# Patient Record
Sex: Female | Born: 1970 | Race: White | Hispanic: No | Smoking: Never smoker
Health system: Southern US, Community
[De-identification: ages and names within clinical notes are randomized; demographics above are authoritative.]

## PROBLEM LIST (undated history)

## (undated) DIAGNOSIS — G5600 Carpal tunnel syndrome, unspecified upper limb: Secondary | ICD-10-CM

## (undated) DIAGNOSIS — E785 Hyperlipidemia, unspecified: Secondary | ICD-10-CM

## (undated) DIAGNOSIS — A4902 Methicillin resistant Staphylococcus aureus infection, unspecified site: Secondary | ICD-10-CM

## (undated) DIAGNOSIS — K219 Gastro-esophageal reflux disease without esophagitis: Secondary | ICD-10-CM

## (undated) DIAGNOSIS — N6019 Diffuse cystic mastopathy of unspecified breast: Secondary | ICD-10-CM

## (undated) DIAGNOSIS — G43909 Migraine, unspecified, not intractable, without status migrainosus: Secondary | ICD-10-CM

## (undated) DIAGNOSIS — E039 Hypothyroidism, unspecified: Secondary | ICD-10-CM

## (undated) DIAGNOSIS — M25512 Pain in left shoulder: Secondary | ICD-10-CM

## (undated) DIAGNOSIS — N200 Calculus of kidney: Secondary | ICD-10-CM

## (undated) DIAGNOSIS — R0600 Dyspnea, unspecified: Secondary | ICD-10-CM

## (undated) HISTORY — DX: Diffuse cystic mastopathy of unspecified breast: N60.19

## (undated) HISTORY — DX: Gastro-esophageal reflux disease without esophagitis: K21.9

## (undated) HISTORY — DX: Calculus of kidney: N20.0

## (undated) HISTORY — DX: Hyperlipidemia, unspecified: E78.5

## (undated) HISTORY — DX: Carpal tunnel syndrome, unspecified upper limb: G56.00

## (undated) HISTORY — DX: Methicillin resistant Staphylococcus aureus infection, unspecified site: A49.02

---

## 1993-04-16 HISTORY — PX: DIAGNOSTIC LAPAROSCOPY: SUR761

## 1998-06-03 ENCOUNTER — Other Ambulatory Visit: Admission: RE | Admit: 1998-06-03 | Discharge: 1998-06-03 | Payer: Self-pay | Admitting: Obstetrics and Gynecology

## 1998-11-29 ENCOUNTER — Other Ambulatory Visit: Admission: RE | Admit: 1998-11-29 | Discharge: 1998-11-29 | Payer: Self-pay | Admitting: Obstetrics and Gynecology

## 2000-01-25 ENCOUNTER — Other Ambulatory Visit: Admission: RE | Admit: 2000-01-25 | Discharge: 2000-01-25 | Payer: Self-pay | Admitting: Obstetrics and Gynecology

## 2000-07-22 ENCOUNTER — Other Ambulatory Visit: Admission: RE | Admit: 2000-07-22 | Discharge: 2000-07-22 | Payer: Self-pay | Admitting: Obstetrics and Gynecology

## 2001-01-24 ENCOUNTER — Other Ambulatory Visit: Admission: RE | Admit: 2001-01-24 | Discharge: 2001-01-24 | Payer: Self-pay | Admitting: Obstetrics and Gynecology

## 2002-03-19 ENCOUNTER — Other Ambulatory Visit: Admission: RE | Admit: 2002-03-19 | Discharge: 2002-03-19 | Payer: Self-pay | Admitting: Obstetrics and Gynecology

## 2002-04-16 DIAGNOSIS — G5600 Carpal tunnel syndrome, unspecified upper limb: Secondary | ICD-10-CM

## 2002-04-16 HISTORY — DX: Carpal tunnel syndrome, unspecified upper limb: G56.00

## 2003-03-22 ENCOUNTER — Other Ambulatory Visit: Admission: RE | Admit: 2003-03-22 | Discharge: 2003-03-22 | Payer: Self-pay | Admitting: Obstetrics and Gynecology

## 2004-03-23 ENCOUNTER — Other Ambulatory Visit: Admission: RE | Admit: 2004-03-23 | Discharge: 2004-03-23 | Payer: Self-pay | Admitting: *Deleted

## 2005-04-27 ENCOUNTER — Other Ambulatory Visit: Admission: RE | Admit: 2005-04-27 | Discharge: 2005-04-27 | Payer: Self-pay | Admitting: Obstetrics & Gynecology

## 2006-04-16 DIAGNOSIS — A4902 Methicillin resistant Staphylococcus aureus infection, unspecified site: Secondary | ICD-10-CM

## 2006-04-16 HISTORY — DX: Methicillin resistant Staphylococcus aureus infection, unspecified site: A49.02

## 2006-06-14 ENCOUNTER — Other Ambulatory Visit: Admission: RE | Admit: 2006-06-14 | Discharge: 2006-06-14 | Payer: Self-pay | Admitting: Obstetrics & Gynecology

## 2007-06-19 ENCOUNTER — Other Ambulatory Visit: Admission: RE | Admit: 2007-06-19 | Discharge: 2007-06-19 | Payer: Self-pay | Admitting: Obstetrics & Gynecology

## 2007-07-02 ENCOUNTER — Encounter: Admission: RE | Admit: 2007-07-02 | Discharge: 2007-07-02 | Payer: Self-pay | Admitting: Obstetrics & Gynecology

## 2007-07-14 ENCOUNTER — Encounter: Admission: RE | Admit: 2007-07-14 | Discharge: 2007-07-14 | Payer: Self-pay | Admitting: Obstetrics & Gynecology

## 2007-12-17 ENCOUNTER — Encounter: Admission: RE | Admit: 2007-12-17 | Discharge: 2007-12-17 | Payer: Self-pay | Admitting: Obstetrics & Gynecology

## 2008-06-24 ENCOUNTER — Other Ambulatory Visit: Admission: RE | Admit: 2008-06-24 | Discharge: 2008-06-24 | Payer: Self-pay | Admitting: Obstetrics & Gynecology

## 2011-04-05 ENCOUNTER — Ambulatory Visit
Admission: RE | Admit: 2011-04-05 | Discharge: 2011-04-05 | Disposition: A | Discharge status: Home / Self Care | Payer: Self-pay | Source: Ambulatory Visit | Attending: Unknown Physician Specialty | Admitting: Unknown Physician Specialty

## 2011-04-05 ENCOUNTER — Other Ambulatory Visit: Payer: Self-pay | Admitting: Unknown Physician Specialty

## 2011-04-05 DIAGNOSIS — T1490XA Injury, unspecified, initial encounter: Secondary | ICD-10-CM

## 2011-04-05 DIAGNOSIS — M25561 Pain in right knee: Secondary | ICD-10-CM

## 2011-09-05 ENCOUNTER — Other Ambulatory Visit: Payer: Self-pay | Admitting: Obstetrics & Gynecology

## 2011-09-05 DIAGNOSIS — Z1231 Encounter for screening mammogram for malignant neoplasm of breast: Secondary | ICD-10-CM

## 2011-10-04 ENCOUNTER — Ambulatory Visit
Admission: RE | Admit: 2011-10-04 | Discharge: 2011-10-04 | Disposition: A | Discharge status: Home / Self Care | Payer: 59 | Source: Ambulatory Visit | Attending: Obstetrics & Gynecology | Admitting: Obstetrics & Gynecology

## 2011-10-04 DIAGNOSIS — Z1231 Encounter for screening mammogram for malignant neoplasm of breast: Secondary | ICD-10-CM

## 2012-04-19 ENCOUNTER — Emergency Department
Admission: EM | Admit: 2012-04-19 | Discharge: 2012-04-19 | Disposition: A | Discharge status: Home / Self Care | Payer: 59 | Source: Home / Self Care | Attending: Family Medicine | Admitting: Family Medicine

## 2012-04-19 DIAGNOSIS — B309 Viral conjunctivitis, unspecified: Secondary | ICD-10-CM

## 2012-04-19 DIAGNOSIS — G43009 Migraine without aura, not intractable, without status migrainosus: Secondary | ICD-10-CM | POA: Insufficient documentation

## 2012-04-19 HISTORY — DX: Migraine, unspecified, not intractable, without status migrainosus: G43.909

## 2012-04-19 MED ORDER — POLYMYXIN B-TRIMETHOPRIM 10000-0.1 UNIT/ML-% OP SOLN: 1.0000 [drp] | OPHTHALMIC | Status: DC

## 2012-04-19 NOTE — ED Notes (Addendum)
Jane Hill complains of redness and pain in her right eye for 1 day. She has also had redness and drainage in her left eye beginning this am. Denies fever, chills or sweats. She has had some sore throat and runny nose for one month. She was seen by her doctor, Dr Sharee Pimple, and treated with an antibiotic.

## 2012-04-19 NOTE — ED Provider Notes (Signed)
History     CSN: [id number]  Arrival date & time 04/19/12  0908   First MD Initiated Contact with Patient 04/19/12 0932      Chief Complaint  Patient presents with  . Eye Drainage    Left eye drainage x this am   . Eye Problem    right eye red and painful x 1 day     HPI Comments: Patient complains of onset of mild redness to her right eye at about 10AM yesterday, developing increasing irritation through the day.  Later, about 4PM, her left eye developed mild redness and irritation.  She wears contacts and stopped using them.  This morning she awoke with both eyes equally red, but has increased discomfort in her right upper lid.  She normally has perennial rhinitis but feels that she is somewhat more congested than usual.  She feels well otherwise.  No known foreign bodies   The history is provided by the patient.    Past Medical History  Diagnosis Date  . Migraine     History reviewed. No pertinent past surgical history.  Family History  Problem Relation Age of Onset  . Cancer Other     breast  . Heart disease Other   . Heart disease Other     History  Substance Use Topics  . Smoking status: Never Smoker   . Smokeless tobacco: Not on file  . Alcohol Use: Yes    OB History    Grav Para Term Preterm Abortions TAB SAB Ect Mult Living                  Review of Systems No sore throat No cough No pleuritic pain No wheezing + mild nasal congestion ? post-nasal drainage No sinus pain/pressure + itchy/red eyes No earache No hemoptysis No SOB No fever/chills No nausea No vomiting No abdominal pain No diarrhea No urinary symptoms No skin rashes No fatigue No myalgias No headache Used OTC meds without relief  Allergies  Review of patient's allergies indicates no known allergies.  Home Medications   Current Outpatient Rx  Name  Route  Sig  Dispense  Refill  . ELETRIPTAN HYDROBROMIDE 40 MG PO TABS   Oral   One tablet by mouth at onset of headache.  May repeat in 2 hours if headache persists or recurs. may repeat in 2 hours if necessary         . PROPRANOLOL HCL ER 120 MG PO CP24   Oral   Take 120 mg by mouth daily.         Marland Kitchen VITAMIN C 500 MG PO TABS   Oral   Take 500 mg by mouth daily.         Marland Kitchen ZONISAMIDE 100 MG PO CAPS   Oral   Take 200 mg by mouth daily.         Marland Kitchen POLYMYXIN B-TRIMETHOPRIM 10000-0.1 UNIT/ML-% OP SOLN   Both Eyes   Place 1 drop into both eyes every 4 (four) hours. (Rx void after 04/27/12)   10 mL   0     BP 128/84  Pulse 64  Temp 98.4 F (36.9 C) (Oral)  Resp 18  Ht 5' 5.5" (1.664 m)  Wt 217 lb (98.431 kg)  BMI 35.56 kg/m2  SpO2 100%  LMP 04/18/2012  Physical Exam Nursing notes and Vital Signs reviewed. Appearance:  Patient appears stated age, and in no acute distress.  Patient is obese (BMI 35.6) Eyes:  Pupils are  equal, round, and reactive to light and accomodation.  Extraocular movement is intact.  Conjunctivae are mildly injected.  No discharge noted.  No photophobia.  No lid swelling although there is mild tenderness of the right upper lid.  Right lid eversion reveals no chalazion or foreign body Ears:  Canals normal.  Tympanic membranes normal.  Nose:  Mildly congested turbinates.  No sinus tenderness.  Pharynx:  Normal Neck:  Supple.  Slightly tender shotty posterior nodes are palpated bilaterally  Lungs:  Clear to auscultation.  Breath sounds are equal.  Heart:  Regular rate and rhythm without murmurs, rubs, or gallops.  Skin:  No rash present.     ED Course  Procedures  none      1. Viral conjunctivitis; no evidence bacterial infection; suspect early viral URI       MDM   Instil cool lubricating eye drops or eye wetting solution several times daily for about two days.  If not improving begin antibiotic eye drops.  Given Rx for Polytrim opth soln to hold. Followup with ophthalmologist if not improving about 5 to 7 days.   Advised that she will probably develop other URI  symptoms         Lattie Haw, MD 04/19/12 1149

## 2012-05-23 DIAGNOSIS — G43009 Migraine without aura, not intractable, without status migrainosus: Secondary | ICD-10-CM | POA: Insufficient documentation

## 2012-10-15 ENCOUNTER — Encounter: Payer: Self-pay | Admitting: Certified Nurse Midwife

## 2012-10-15 ENCOUNTER — Encounter: Payer: Self-pay | Admitting: Obstetrics & Gynecology

## 2012-10-16 ENCOUNTER — Ambulatory Visit (INDEPENDENT_AMBULATORY_CARE_PROVIDER_SITE_OTHER): Payer: 59 | Admitting: Obstetrics & Gynecology

## 2012-10-16 ENCOUNTER — Encounter: Payer: Self-pay | Admitting: Obstetrics & Gynecology

## 2012-10-16 VITALS — BP 110/78 | Ht 65.25 in | Wt 218.0 lb

## 2012-10-16 DIAGNOSIS — Z0000 Encounter for general adult medical examination without abnormal findings: Secondary | ICD-10-CM

## 2012-10-16 DIAGNOSIS — Z01419 Encounter for gynecological examination (general) (routine) without abnormal findings: Secondary | ICD-10-CM

## 2012-10-16 LAB — POCT URINALYSIS DIPSTICK
Blood, UA: NEGATIVE
Protein, UA: NEGATIVE
Urobilinogen, UA: NEGATIVE
pH, UA: 6

## 2012-10-16 NOTE — Progress Notes (Signed)
42 y.o. G0P0000 DivorcedCaucasianF here for annual exam.  Going to [facility] for girls weekend in three weeks.    Patient's last menstrual period was 09/19/2012.          Sexually active: no  The current method of family planning is abstinence.  She does have a Skyla IUD placed 11/27/11 Exercising: yes Home exercise routine includes Daily Burn. Smoker:  no  Health Maintenance: Pap: 09/20/2011 neg with neg HR HPV History of abnormal Pap:  Yes, 2/98 had colposcopy MMG:  09/2011 benign Colonoscopy:  No BMD:  No TDaP:  3/09 Screening Labs: today, Hb today: 13.4, Urine today: neg   reports that she has never smoked. She has never used smokeless tobacco. She reports that she drinks about 0.5 ounces of alcohol per week. She reports that she does not use illicit drugs.  Past Medical History  Diagnosis Date  . Migraine   . Fibrocystic breast   . Glucosuria   . Fibromyalgia   . Interstitial cystitis 2002  . Carpal tunnel syndrome 2004    R  . Diabetes mellitus without complication 2004    diet control  . Community acquired MRSA infection 2008    Past Surgical History  Procedure Laterality Date  . Breast biopsy  1990  . Diagnostic laparoscopy  1995    Current Outpatient Prescriptions  Medication Sig Dispense Refill  . baclofen (LIORESAL) 10 MG tablet Take 10 mg by mouth as needed.       . eletriptan (RELPAX) 40 MG tablet One tablet by mouth at onset of headache. May repeat in 2 hours if headache persists or recurs. may repeat in 2 hours if necessary      . Levonorgestrel (SKYLA) 13.5 MG IUD by Intrauterine route.      . Multiple Vitamins-Minerals (MULTIVITAMIN PO) Take by mouth daily.       . propranolol ER (INDERAL LA) 120 MG 24 hr capsule Take 120 mg by mouth daily.      . vitamin C (ASCORBIC ACID) 500 MG tablet Take 500 mg by mouth daily.      Marland Kitchen zonisamide (ZONEGRAN) 100 MG capsule Take 100 mg by mouth 3 (three) times daily.       Marland Kitchen Fexofenadine HCl (ALLEGRA PO) Take by mouth as  needed.        No current facility-administered medications for this visit.    Family History  Problem Relation Age of Onset  . Cancer Other     breast  . Heart disease Other   . Heart disease Other     ROS:  Pertinent items are noted in HPI.  Otherwise, a comprehensive ROS was negative.  Exam:   BP 110/78  Ht 5' 5.25" (1.657 m)  Wt 218 lb (98.884 kg)  BMI 36.01 kg/m2  LMP 09/19/2012  Weight change:+10 Height: 5' 5.25" (165.7 cm)  Ht Readings from Last 3 Encounters:  10/16/12 5' 5.25" (1.657 m)  04/19/12 5' 5.5" (1.664 m)    General appearance: alert, cooperative and appears stated age Head: Normocephalic, without obvious abnormality, atraumatic Neck: no adenopathy, supple, symmetrical, trachea midline and thyroid normal to inspection and palpation Lungs: clear to auscultation bilaterally Breasts: normal appearance, no masses or tenderness Heart: regular rate and rhythm Abdomen: soft, non-tender; bowel sounds normal; no masses,  no organomegaly Extremities: extremities normal, atraumatic, no cyanosis or edema Skin: Skin color, texture, turgor normal. No rashes or lesions Lymph nodes: Cervical, supraclavicular, and axillary nodes normal. No abnormal inguinal nodes palpated Neurologic: Grossly  normal   Pelvic: External genitalia:  no lesions              Urethra:  normal appearing urethra with no masses, tenderness or lesions              Bartholins and Skenes: normal                 Vagina: normal appearing vagina with normal color and discharge, no lesions              Cervix: no lesions              Pap taken: no Bimanual Exam:  Uterus:  normal size, contour, position, consistency, mobility, non-tender              Adnexa: normal adnexa and no mass, fullness, tenderness               Rectovaginal: Confirms               Anus:  normal sphincter tone, no lesions  A:  Well Woman with normal exam Skyla IUD placed 11/27/11.  P:   Mammogram yearly.  Pt states she will  schedule.  Has reminder. pap smear with HR HPV neg 6/13 Lipids (non fasting), CMP, TSH return annually or prn  An After Visit Summary was printed and given to the patient.

## 2012-10-16 NOTE — Patient Instructions (Addendum)

## 2012-10-17 LAB — LIPID PANEL
Cholesterol: 190 mg/dL (ref 0–200)
Total CHOL/HDL Ratio: 4.4 Ratio
VLDL: 22 mg/dL (ref 0–40)

## 2012-10-17 LAB — COMPREHENSIVE METABOLIC PANEL
Alkaline Phosphatase: 79 U/L (ref 39–117)
Glucose, Bld: 63 mg/dL — ABNORMAL LOW (ref 70–99)
Sodium: 137 mEq/L (ref 135–145)
Total Bilirubin: 0.4 mg/dL (ref 0.3–1.2)
Total Protein: 6.8 g/dL (ref 6.0–8.3)

## 2012-10-17 LAB — TSH: TSH: 4.305 u[IU]/mL (ref 0.350–4.500)

## 2012-10-20 ENCOUNTER — Telehealth: Payer: Self-pay

## 2012-10-20 NOTE — Telephone Encounter (Signed)
7/7 lmtcb//kn 

## 2012-10-20 NOTE — Telephone Encounter (Signed)
Message copied by Elisha Headland on Mon Oct 20, 2012  8:53 AM ------      Message from: Jerene Bears      Created: Sat Oct 18, 2012  9:15 PM       Inform cholesterol ok.  Total 190.  LDL 125.  HDL low at 43.  Needs to be followed yearly and tested fasting next time.  TSH nl.  CMP showed mildly elevated creatinine.  This is just mildly increased.  Other values normal.  Will also retest next year. ------

## 2012-11-10 NOTE — Telephone Encounter (Signed)
7/28 lmtcb/kn 

## 2013-01-12 NOTE — Telephone Encounter (Signed)
Left several messages for patient and sent a letter to call us back. No response. Per Dr Lisabeth Register to file this. Will notify patient if she calls back.

## 2013-03-09 ENCOUNTER — Other Ambulatory Visit: Payer: Self-pay

## 2013-03-09 DIAGNOSIS — Z1231 Encounter for screening mammogram for malignant neoplasm of breast: Secondary | ICD-10-CM

## 2013-04-03 ENCOUNTER — Ambulatory Visit
Admission: RE | Admit: 2013-04-03 | Discharge: 2013-04-03 | Disposition: A | Discharge status: Home / Self Care | Payer: 59 | Source: Ambulatory Visit

## 2013-04-03 DIAGNOSIS — Z1231 Encounter for screening mammogram for malignant neoplasm of breast: Secondary | ICD-10-CM

## 2013-12-29 ENCOUNTER — Encounter: Payer: Self-pay | Admitting: Obstetrics & Gynecology

## 2013-12-29 ENCOUNTER — Ambulatory Visit (INDEPENDENT_AMBULATORY_CARE_PROVIDER_SITE_OTHER): Payer: 59 | Admitting: Obstetrics & Gynecology

## 2013-12-29 VITALS — Ht 65.25 in | Wt 227.0 lb

## 2013-12-29 DIAGNOSIS — Z30432 Encounter for removal of intrauterine contraceptive device: Secondary | ICD-10-CM

## 2013-12-29 DIAGNOSIS — Z124 Encounter for screening for malignant neoplasm of cervix: Secondary | ICD-10-CM

## 2013-12-29 DIAGNOSIS — Z01419 Encounter for gynecological examination (general) (routine) without abnormal findings: Secondary | ICD-10-CM

## 2013-12-29 DIAGNOSIS — Z30430 Encounter for insertion of intrauterine contraceptive device: Secondary | ICD-10-CM

## 2013-12-29 LAB — POCT URINALYSIS DIPSTICK
BILIRUBIN UA: NEGATIVE
GLUCOSE UA: NEGATIVE
Ketones, UA: NEGATIVE
NITRITE UA: NEGATIVE
PH UA: 5
Protein, UA: NEGATIVE
UROBILINOGEN UA: NEGATIVE

## 2013-12-29 MED ORDER — MISOPROSTOL 200 MCG PO TABS: ORAL_TABLET | ORAL | Status: DC

## 2013-12-29 NOTE — Progress Notes (Signed)
43 y.o. G0P0000 Divorced CaucasianF here for annual exam.  Last three months, bleeding more days than not.  None of them are heavy but this is really annoying for pt.  Most of the time, she only needs a panty liner.   Seeing PCP:  Dr. Idelle Leech, [place], next week.  Will have blood work then.     Patient's last menstrual period was 12/18/2013.          Sexually active: No.  The current method of family planning is IUD.  Placed 11/27/11. Exercising: Yes.    Home exercise routine includes Daily Burn videos. Smoker:  no  Health Maintenance: Pap:  09/20/2011 neg HR HPV History of abnormal Pap:  Yes, 2/98 has coloscopy MMG:  04/03/13 Bi-Rads Neg Colonoscopy:  No BMD:   No TDaP:  06/2007 Screening Labs: pcp, Hb today: pcp, Urine today: RBC-trace WBC-trace, ph: 5.0   reports that she has never smoked. She has never used smokeless tobacco. She reports that she drinks about .5 ounces of alcohol per week. She reports that she does not use illicit drugs.  Past Medical History  Diagnosis Date  . Migraine   . Fibrocystic breast   . Carpal tunnel syndrome 2004    R  . Community acquired MRSA infection 2008    Past Surgical History  Procedure Laterality Date  . Diagnostic laparoscopy  1995    LOA    Current Outpatient Prescriptions  Medication Sig Dispense Refill  . Fexofenadine HCl (ALLEGRA PO) Take by mouth as needed.       . Levonorgestrel (SKYLA) 13.5 MG IUD by Intrauterine route.      . pyridOXINE (VITAMIN B-6) 100 MG tablet Take 100 mg by mouth daily.      . vitamin C (ASCORBIC ACID) 500 MG tablet Take 500 mg by mouth daily.       No current facility-administered medications for this visit.    Family History  Problem Relation Age of Onset  . Cancer Paternal Grandmother 73    breast  . Heart disease Maternal Grandmother   . Heart disease Maternal Grandfather     ROS:  Pertinent items are noted in HPI.  Otherwise, a comprehensive ROS was negative.  Exam:   Ht 5' 5.25"  (1.657 m)  Wt 227 lb (102.967 kg)  BMI 37.50 kg/m2  LMP 12/18/2013    Height: 5' 5.25" (165.7 cm)  Ht Readings from Last 3 Encounters:  12/29/13 5' 5.25" (1.657 m)  10/16/12 5' 5.25" (1.657 m)  04/19/12 5' 5.5" (1.664 m)    General appearance: alert, cooperative and appears stated age Head: Normocephalic, without obvious abnormality, atraumatic Neck: no adenopathy, supple, symmetrical, trachea midline and thyroid normal to inspection and palpation Lungs: clear to auscultation bilaterally Breasts: normal appearance, no masses or tenderness Heart: regular rate and rhythm Abdomen: soft, non-tender; bowel sounds normal; no masses,  no organomegaly Extremities: extremities normal, atraumatic, no cyanosis or edema Skin: Skin color, texture, turgor normal. No rashes or lesions Lymph nodes: Cervical, supraclavicular, and axillary nodes normal. No abnormal inguinal nodes palpated Neurologic: Grossly normal   Pelvic: External genitalia:  no lesions              Urethra:  normal appearing urethra with no masses, tenderness or lesions              Bartholins and Skenes: normal                 Vagina: normal appearing vagina with  normal color and discharge, no lesions              Cervix: no lesions              Pap taken: Yes.   Bimanual Exam:  Uterus:  normal size, contour, position, consistency, mobility, non-tender              Adnexa: normal adnexa and no mass, fullness, tenderness               Rectovaginal: Confirms               Anus:  normal sphincter tone, no lesions  A:  Well Woman with normal exam  Skyla IUD placed 11/27/11. DUB last three months. Migraines.  Off all medications.   DUB with Skyla  P: Mammogram yearly.   pap smear with HR HPV neg 6/13.  Pap only today. Due to DUB, d/w pt options--removal and replacement with new Skyla, PUS first, change to Mirena.  Pt wants to try Mirena IUD first.  Cytotec 239mcg pv pm and am before procedure. Labs/appt with PCP next  week return annually or prn  An After Visit Summary was printed and given to the patient.

## 2014-01-01 LAB — IPS PAP TEST WITH REFLEX TO HPV

## 2014-01-07 ENCOUNTER — Ambulatory Visit (INDEPENDENT_AMBULATORY_CARE_PROVIDER_SITE_OTHER): Payer: 59 | Admitting: Obstetrics & Gynecology

## 2014-01-07 VITALS — BP 108/70 | HR 64 | Resp 12 | Ht 65.25 in | Wt 226.6 lb

## 2014-01-07 DIAGNOSIS — Z30430 Encounter for insertion of intrauterine contraceptive device: Secondary | ICD-10-CM

## 2014-01-07 DIAGNOSIS — Z30432 Encounter for removal of intrauterine contraceptive device: Secondary | ICD-10-CM

## 2014-01-07 DIAGNOSIS — Z30433 Encounter for removal and reinsertion of intrauterine contraceptive device: Secondary | ICD-10-CM

## 2014-01-07 NOTE — Progress Notes (Signed)
Patient ID: Jane Hill, female   DOB: December 03, 1970, 43 y.o.   MRN: [id number]  43 y.o. G0P0000 Single Caucasian female presents for removal of Skyla IUD due to DUB.  Pt has decided to try Mirena IUD as she has liked the lack of "hormonal" symptoms and the ease of use.  iShe has been counseled about alternative forms of birth control including oral contraceptives, progesterone methods, IUD, barrier method, and sterilization.  She has decided to proceed with IUD placement.  Currently, she denies any vaginal symptoms or STD concerns.  LMP:  Patient's last menstrual period was 12/18/2013.  Healthy female:  WNWDWF NAD  Pelvic exam: Vulva;normal female genitalia Vagina:normal vagina, no discharge, exudate, lesion, or erythema Cervix:Non-tender, Negative CMT, no lesions or redness Uterus:normal shape, position and consistency   After patient read information booklet and all questions were answered, informed consent was obtained.    Procedure:  Speculum inserted into vagina. Cervix visualized and cleansed with betadine solution X 3. Paracervical block was placed.  1% Lidocaine was used.  8cc total.  Lot #: [id number]  Exp:09/15/14.  Tenaculum placed on cervix at 12 o'clock position.  Skyla IUD removed first.  Uterus sounded to 7 centimeters.  IUD removed from sterile packet and under sterile conditions inserted to fundus of uterus.  Introducer removed without difficulty.  IUD string trimmed to 2 centimeters.  Remainder string given to patient to feel for identification.  Tenaculum removed.  minimal bleeding noted.  Speculum removed.  Uterus palpated normal.  Patient tolerated procedure well.  IUD Lot #: [id number].  Exp: 8/17.  Package information attached to consent and scanned into EPIC.  A: Insertion of Mirena, Lot # [id number], Expiration date 8/17   P: Return to office 6 weeks for recheck Pt knows IUD needs to be replaced approximately 12/2018--5 years.  Instructions provided.

## 2014-02-11 ENCOUNTER — Telehealth: Payer: Self-pay | Admitting: Obstetrics & Gynecology

## 2014-02-11 NOTE — Telephone Encounter (Signed)
Spoke with patient. Patient states that she has an IUD recheck appointment on 11/3 but would like to schedule to have IUD removed. "I have been having bleeding every day except one week and cramping since I switched from the [facility] to the [employer]. I just want to have it taken out to let my body do it's own thing." Patient had skyla removed and mirena inserted for DUB on 01/07/14. Cancelled appointment for 11/3. Scheduled patient for first available procedure slot for IUD removal on 11/20 at 3:30 pm with Dr.Miller. Patient is agreeable to date and time. Patient requesting to have cytotec again for removal. Advised patient will send a message to Dr.Miller to let her know of removal and request for prescription. Advised will return call after speaking with Dr.Miller. Patient is agreeable to date and time.  Okay to place order for cytotec for removal?

## 2014-02-11 NOTE — Telephone Encounter (Signed)
Pt called during lunch wanting to change her appt on 02/16/14 she wants the have the IUD removed.

## 2014-02-12 NOTE — Telephone Encounter (Signed)
Spoke with patient. Advised patient of message as seen below from [place]. Patient is agreeable and verbalizes understanding.

## 2014-02-12 NOTE — Telephone Encounter (Signed)
Doesn't need cytotec for removal.  I will just remove it when I see her and counsel her about typical bleeding patterns.  Thanks.  Encounter closed.

## 2014-02-16 ENCOUNTER — Ambulatory Visit: Payer: 59 | Admitting: Obstetrics & Gynecology

## 2014-03-05 ENCOUNTER — Ambulatory Visit: Payer: 59 | Admitting: Obstetrics & Gynecology

## 2014-03-08 ENCOUNTER — Ambulatory Visit: Payer: 59 | Admitting: Obstetrics & Gynecology

## 2014-03-08 ENCOUNTER — Telehealth: Payer: Self-pay

## 2014-03-08 DIAGNOSIS — N938 Other specified abnormal uterine and vaginal bleeding: Secondary | ICD-10-CM

## 2014-03-08 NOTE — Telephone Encounter (Signed)
Orders placed for IUD removal and possible PUS. Patient has appointment tomorrow 11/24 at 12:45pm with Dr.Miller. See previous telephone encounter.  Cc: Jane Hill  Routing to provider for final review. Patient agreeable to disposition. Will close encounter

## 2014-03-09 ENCOUNTER — Encounter: Payer: Self-pay | Admitting: Obstetrics & Gynecology

## 2014-03-09 ENCOUNTER — Ambulatory Visit (INDEPENDENT_AMBULATORY_CARE_PROVIDER_SITE_OTHER): Payer: 59 | Admitting: Obstetrics & Gynecology

## 2014-03-09 VITALS — BP 128/80 | HR 72 | Resp 16 | Ht 65.25 in | Wt 235.0 lb

## 2014-03-09 DIAGNOSIS — Z30432 Encounter for removal of intrauterine contraceptive device: Secondary | ICD-10-CM

## 2014-03-09 DIAGNOSIS — N938 Other specified abnormal uterine and vaginal bleeding: Secondary | ICD-10-CM

## 2014-03-13 NOTE — Progress Notes (Signed)
43 y.o. G0P0000 Single Caucasian female presents for consideration of removal of IUD.  Pt has spotted since placement of both Skyla and then Mirena IUD.  Just totally sick of it.  D/W pt possible PUS first to confirm correct placement.  Pt just wants IUD out.  Doesn't really matter to her if in the correct location.  Just wants bleeding to stop.  Has PUS last year without any pathology present.  Not SA so just wants to wait and see what happens with her bleeding before doing anything else hormonal.  LMP:  No LMP recorded. Patient is not currently having periods (Reason: IUD).  Healthy female:  WNWD WF NAD Abdomen: soft, non-tender  Pelvic exam: Vulva;normal female genitalia Vagina:normal vagina, no discharge, exudate, lesion, or erythema Cervix:Non-tender, Negative CMT, no lesions or redness, IUD string noted Uterus:normal shape, position and consistency   Procedure:  Speculum inserted into vagina. IUD string seen.  Grasped with ringed forceps and with one pull, IUD removed.  Pt tolerated procedure well.   A: Removal of Mirena   P: Pt will call if still spotting after another month for repeat PUS or if desires to start OCPs again.  All questions answered.

## 2014-05-25 ENCOUNTER — Telehealth: Payer: Self-pay | Admitting: Obstetrics & Gynecology

## 2014-05-25 ENCOUNTER — Other Ambulatory Visit: Payer: Self-pay

## 2014-05-25 ENCOUNTER — Ambulatory Visit (INDEPENDENT_AMBULATORY_CARE_PROVIDER_SITE_OTHER): Payer: No Typology Code available for payment source | Admitting: Obstetrics & Gynecology

## 2014-05-25 VITALS — BP 118/78 | HR 64 | Resp 16 | Ht 65.25 in | Wt 241.6 lb

## 2014-05-25 DIAGNOSIS — N644 Mastodynia: Secondary | ICD-10-CM

## 2014-05-25 DIAGNOSIS — Z1231 Encounter for screening mammogram for malignant neoplasm of breast: Secondary | ICD-10-CM

## 2014-05-25 NOTE — Progress Notes (Signed)
Subjective:     Patient ID: Jane Hill, female   DOB: 06-10-1970, 44 y.o.   MRN: [id number]  HPI 44 yo G0P0 SWF here for right sided breast/axillary pain that has been present for a couple of weeks.  Pain is dull and achy and not enough to require pain medications.  Pt denies any trauma or breast lumps that she has felt.  Denies nipple discharge.  No skin changes.  She called to make an appt for a screening MMG and was advised she needed to be seen first as she may need a diagnostic mammogram.  Last MMG 03/24/13 at the [place].  This was negative.  She has Grade B density.  No family hx of breast cancer or other breast disease.  Review of Systems  All other systems reviewed and are negative.      Objective:   Physical Exam  Constitutional: She appears well-developed and well-nourished.  Neck: Normal range of motion. Neck supple. No thyromegaly present.  Pulmonary/Chest: Right breast exhibits tenderness (in upper outer quadrant). Right breast exhibits no inverted nipple, no mass, no nipple discharge and no skin change. Left breast exhibits no inverted nipple, no mass, no nipple discharge, no skin change and no tenderness. Breasts are symmetrical.  Lymphadenopathy:    She has no cervical adenopathy.       Assessment:     Breast tenderness No discrete mass or abnormal findings     Plan:     I do not think she needs a diagnostic test as this time.  Will have my triage nurse call and schedule pt for a screening MMG.  Caffeine intake discussed.  Pt aware to notify me if she has persistent pain after her next menstrual cycle.  Voices understanding.

## 2014-05-25 NOTE — Progress Notes (Signed)
Patient scheduled for screening mammogram at [facility] for 06/03/14 at 0900. Patient agreeable to time/date/location.

## 2014-05-25 NOTE — Telephone Encounter (Signed)
[facility] calling [facility] told her to call for a referral for tenderness under her arm.

## 2014-05-25 NOTE — Telephone Encounter (Signed)
Patient reports one month of tenderness under R arm when completing breast self exam. Tried to schedule her screening mammogram and was referred back to our office. Appointment with Dr.  Sabra Heck scheduled for today for breast check. Routing to provider for final review. Patient agreeable to disposition. Will close encounter

## 2014-05-30 ENCOUNTER — Encounter: Payer: Self-pay | Admitting: Obstetrics & Gynecology

## 2014-06-03 ENCOUNTER — Ambulatory Visit
Admission: RE | Admit: 2014-06-03 | Discharge: 2014-06-03 | Disposition: A | Discharge status: Home / Self Care | Payer: 59 | Source: Ambulatory Visit

## 2014-06-03 DIAGNOSIS — Z1231 Encounter for screening mammogram for malignant neoplasm of breast: Secondary | ICD-10-CM

## 2014-06-04 ENCOUNTER — Other Ambulatory Visit: Payer: Self-pay | Admitting: Obstetrics & Gynecology

## 2014-06-04 DIAGNOSIS — R928 Other abnormal and inconclusive findings on diagnostic imaging of breast: Secondary | ICD-10-CM

## 2014-06-11 ENCOUNTER — Other Ambulatory Visit: Payer: Self-pay

## 2014-06-11 ENCOUNTER — Other Ambulatory Visit: Payer: Self-pay | Admitting: Obstetrics & Gynecology

## 2014-06-11 DIAGNOSIS — R928 Other abnormal and inconclusive findings on diagnostic imaging of breast: Secondary | ICD-10-CM

## 2014-06-14 ENCOUNTER — Ambulatory Visit
Admission: RE | Admit: 2014-06-14 | Discharge: 2014-06-14 | Disposition: A | Discharge status: Home / Self Care | Payer: 59 | Source: Ambulatory Visit | Attending: Obstetrics & Gynecology | Admitting: Obstetrics & Gynecology

## 2014-06-14 ENCOUNTER — Other Ambulatory Visit: Payer: Self-pay | Admitting: Obstetrics & Gynecology

## 2014-06-14 DIAGNOSIS — R928 Other abnormal and inconclusive findings on diagnostic imaging of breast: Secondary | ICD-10-CM

## 2014-12-14 ENCOUNTER — Ambulatory Visit (INDEPENDENT_AMBULATORY_CARE_PROVIDER_SITE_OTHER): Payer: No Typology Code available for payment source | Admitting: Obstetrics & Gynecology

## 2014-12-14 ENCOUNTER — Encounter: Payer: Self-pay | Admitting: Obstetrics & Gynecology

## 2014-12-14 VITALS — BP 110/70 | HR 64 | Resp 16 | Ht 65.5 in | Wt 238.0 lb

## 2014-12-14 DIAGNOSIS — Z124 Encounter for screening for malignant neoplasm of cervix: Secondary | ICD-10-CM

## 2014-12-14 DIAGNOSIS — Z01419 Encounter for gynecological examination (general) (routine) without abnormal findings: Secondary | ICD-10-CM

## 2014-12-14 DIAGNOSIS — Z0000 Encounter for general adult medical examination without abnormal findings: Secondary | ICD-10-CM | POA: Diagnosis not present

## 2014-12-14 LAB — POCT URINALYSIS DIPSTICK
BILIRUBIN UA: NEGATIVE
Blood, UA: NEGATIVE
Glucose, UA: NEGATIVE
Ketones, UA: NEGATIVE
Nitrite, UA: NEGATIVE
PH UA: 5
Protein, UA: NEGATIVE
Urobilinogen, UA: NEGATIVE

## 2014-12-14 LAB — HEMOGLOBIN, FINGERSTICK: HEMOGLOBIN, FINGERSTICK: 12.6 g/dL (ref 12.0–16.0)

## 2014-12-14 MED ORDER — FLUCONAZOLE 150 MG PO TABS: 150.0000 mg | ORAL_TABLET | Freq: Once | ORAL | Status: DC

## 2014-12-14 NOTE — Progress Notes (Signed)
44 y.o. G0P0000 DivorcedCaucasianF here for annual exam.  Had an "okay" summer.  Last grandparent died this summer so this has been sad for the patient.  Pt reports cycles are the "same".  Flow is heavy at times.  Having some intermittent discharge.  Comes and goes.  Not sexually active.  PCP:  Dr. Dyane Dustman.  Last blood work was in march with appt.      Patient's last menstrual period was 12/04/2014.          Sexually active: No.  The current method of family planning is none.    Exercising: Yes.    walking Smoker:  no  Health Maintenance: Pap:  12/29/13 ASCUS/negative HR HPV History of abnormal Pap:  Yes 2/98 colposcopy MMG:  06/03/14 MMG, 06/14/14 3D diag right breast-BiRads 1-normal Colonoscopy:  none BMD:   none TDaP:  3/09 Screening Labs: , Hb today: 12.6, Urine today: WBC-trace   reports that she has never smoked. She has never used smokeless tobacco. She reports that she drinks about 0.6 - 1.2 oz of alcohol per week. She reports that she does not use illicit drugs.  Past Medical History  Diagnosis Date  . Migraine   . Fibrocystic breast   . Carpal tunnel syndrome 2004    R  . Community acquired MRSA infection 2008    Past Surgical History  Procedure Laterality Date  . Diagnostic laparoscopy  1995    LOA    Current Outpatient Prescriptions  Medication Sig Dispense Refill  . Fexofenadine HCl (ALLEGRA PO) Take by mouth as needed.     . Multiple Vitamins-Minerals (MULTIVITAL PO) Take by mouth. Maxi one daily MVI    . promethazine (PHENERGAN) 25 MG tablet as needed.     . SUMAtriptan (IMITREX) 100 MG tablet Take 100 mg by mouth once.   5  . topiramate (TOPAMAX) 25 MG tablet Currently taking 75mg  daily    . levothyroxine (SYNTHROID, LEVOTHROID) 50 MCG tablet TK 1 T PO D  3   No current facility-administered medications for this visit.    Family History  Problem Relation Age of Onset  . Cancer Paternal Grandmother 61    breast  . Heart disease Maternal  Grandmother   . Heart disease Maternal Grandfather   . Breast cancer Mother 73    02/2013    ROS:  Pertinent items are noted in HPI.  Otherwise, a comprehensive ROS was negative.  Exam:   BP 110/70 mmHg  Pulse 64  Resp 16  Ht 5' 5.5" (1.664 m)  Wt 238 lb (107.956 kg)  BMI 38.99 kg/m2  LMP 12/04/2014  Weight change: +11#Height: 5' 5.5" (166.4 cm)  Ht Readings from Last 3 Encounters:  12/14/14 5' 5.5" (1.664 m)  05/25/14 5' 5.25" (1.657 m)  03/09/14 5' 5.25" (1.657 m)    General appearance: alert, cooperative and appears stated age Head: Normocephalic, without obvious abnormality, atraumatic Neck: no adenopathy, supple, symmetrical, trachea midline and thyroid normal to inspection and palpation Lungs: clear to auscultation bilaterally Breasts: normal appearance, no masses or tenderness Heart: regular rate and rhythm Abdomen: soft, non-tender; bowel sounds normal; no masses,  no organomegaly Extremities: extremities normal, atraumatic, no cyanosis or edema Skin: Skin color, texture, turgor normal. No rashes or lesions Lymph nodes: Cervical, supraclavicular, and axillary nodes normal. No abnormal inguinal nodes palpated Neurologic: Grossly normal   Pelvic: External genitalia:  no lesions              Urethra:  normal appearing  urethra with no masses, tenderness or lesions              Bartholins and Skenes: normal                 Vagina: normal appearing vagina with normal color and discharge, no lesions              Cervix: no lesions              Pap taken: Yes.   Bimanual Exam:  Uterus:  normal size, contour, position, consistency, mobility, non-tender              Adnexa: normal adnexa and no mass, fullness, tenderness               Rectovaginal: Confirms               Anus:  normal sphincter tone, no lesions  Chaperone was present for exam.  A:  Well Woman with normal exam  Skyla IUD placed 11/27/11. Removed 03/09/14 due to DUB Migraines. Back on Topamax.  Was  off last year.   Hypothyroidism Watery vaginal discharge  P: Mammogram yearly.  pap smear with HR HPV neg 6/13. ASCUS with neg HR HPV 2015.  Pap today. Diflucan 150mg  po x 1, repeat 72 hours.  If BV present on Pap, will treat as well. Labs/appt with PCP yearly return annually or prn

## 2014-12-15 LAB — IPS PAP SMEAR ONLY

## 2015-01-07 ENCOUNTER — Ambulatory Visit: Payer: No Typology Code available for payment source | Admitting: Certified Nurse Midwife

## 2015-02-11 ENCOUNTER — Ambulatory Visit: Payer: 59 | Admitting: Obstetrics & Gynecology

## 2015-05-18 ENCOUNTER — Other Ambulatory Visit: Payer: Self-pay

## 2015-05-18 DIAGNOSIS — Z1231 Encounter for screening mammogram for malignant neoplasm of breast: Secondary | ICD-10-CM

## 2015-06-08 ENCOUNTER — Ambulatory Visit
Admission: RE | Admit: 2015-06-08 | Discharge: 2015-06-08 | Disposition: A | Discharge status: Home / Self Care | Payer: 59 | Source: Ambulatory Visit

## 2015-06-08 DIAGNOSIS — Z1231 Encounter for screening mammogram for malignant neoplasm of breast: Secondary | ICD-10-CM

## 2015-06-10 ENCOUNTER — Other Ambulatory Visit: Payer: Self-pay | Admitting: Obstetrics & Gynecology

## 2015-06-10 DIAGNOSIS — R928 Other abnormal and inconclusive findings on diagnostic imaging of breast: Secondary | ICD-10-CM

## 2015-06-15 ENCOUNTER — Other Ambulatory Visit: Payer: Self-pay | Admitting: Obstetrics & Gynecology

## 2015-06-15 ENCOUNTER — Other Ambulatory Visit: Payer: Self-pay

## 2015-06-15 DIAGNOSIS — R928 Other abnormal and inconclusive findings on diagnostic imaging of breast: Secondary | ICD-10-CM

## 2015-06-17 ENCOUNTER — Ambulatory Visit
Admission: RE | Admit: 2015-06-17 | Discharge: 2015-06-17 | Disposition: A | Discharge status: Home / Self Care | Payer: 59 | Source: Ambulatory Visit | Attending: Obstetrics & Gynecology | Admitting: Obstetrics & Gynecology

## 2015-06-17 DIAGNOSIS — R928 Other abnormal and inconclusive findings on diagnostic imaging of breast: Secondary | ICD-10-CM

## 2015-11-14 ENCOUNTER — Other Ambulatory Visit: Payer: Self-pay | Admitting: Obstetrics & Gynecology

## 2015-11-14 DIAGNOSIS — N63 Unspecified lump in unspecified breast: Secondary | ICD-10-CM

## 2015-12-16 DIAGNOSIS — N200 Calculus of kidney: Secondary | ICD-10-CM

## 2015-12-16 HISTORY — DX: Calculus of kidney: N20.0

## 2015-12-21 ENCOUNTER — Other Ambulatory Visit: Payer: 59

## 2015-12-26 ENCOUNTER — Ambulatory Visit
Admission: RE | Admit: 2015-12-26 | Discharge: 2015-12-26 | Disposition: A | Discharge status: Home / Self Care | Payer: 59 | Source: Ambulatory Visit | Attending: Obstetrics & Gynecology | Admitting: Obstetrics & Gynecology

## 2015-12-26 DIAGNOSIS — N63 Unspecified lump in unspecified breast: Secondary | ICD-10-CM

## 2016-04-12 ENCOUNTER — Ambulatory Visit: Payer: No Typology Code available for payment source | Admitting: Obstetrics & Gynecology

## 2016-04-30 ENCOUNTER — Encounter: Payer: Self-pay | Admitting: Obstetrics & Gynecology

## 2016-04-30 ENCOUNTER — Ambulatory Visit (INDEPENDENT_AMBULATORY_CARE_PROVIDER_SITE_OTHER): Payer: 59 | Admitting: Obstetrics & Gynecology

## 2016-04-30 VITALS — BP 120/80 | HR 80 | Resp 16 | Ht 65.5 in | Wt 227.2 lb

## 2016-04-30 DIAGNOSIS — Z124 Encounter for screening for malignant neoplasm of cervix: Secondary | ICD-10-CM

## 2016-04-30 DIAGNOSIS — N921 Excessive and frequent menstruation with irregular cycle: Secondary | ICD-10-CM

## 2016-04-30 DIAGNOSIS — Z01419 Encounter for gynecological examination (general) (routine) without abnormal findings: Secondary | ICD-10-CM

## 2016-04-30 NOTE — Progress Notes (Signed)
46 y.o. G0P0000 DivorcedCaucasianF here for annual exam.  Had some issues with stones this past year.  Passed several stones this past year.  Did go to the ER in [place] at [facility].  Had CT scan showing stones.  Has seen urologist.  Has stopped any extra calcium.    Sees Dr. Joretta Bachelor, neurologist.  Has appt next month.  Having some itching with her right eye.    Reports her cycles have been "wierd" this past year.  She continues to have clotting but that seems worse this year.  Has gone 5-6 weeks between cycles.  When this happens, cycles last 7 days but there is heavier flow and there is more clotting.  Super tampons aren't enough for the amount of bleeding.  She is ready to proceed with some type of treatment.  Does not have any desires regarding child bearing.  Having some left shoulder pain after a fall.  Seeing chiropractor.  Cannot reach behind back.  Not really doing PT but stretches and exercises with chiropractor.  PCP:  Dr. Dory Larsen.  Had blood work this past year.  Patient's last menstrual period was 04/23/2016.          Sexually active: No.  The current method of family planning is none.    Exercising: No.  The patient does not participate in regular exercise at present. Smoker:  no  Health Maintenance: Pap:  12/14/14 negative  History of abnormal Pap:  yes MMG:  06/17/15 [place]: BIRADS 3: probably benign, 12/26/15 [place] BIRADS 3: probably benign- 6 month follow up   Colonoscopy:  No BMD:   No TDaP:  06/19/07  Pneumonia vaccine(s):  No  Zostavax:   No  Hep C testing: not indicated  Screening Labs: PCP, Hb today: PCP, Urine today: PCP   reports that she has never smoked. She has never used smokeless tobacco. She reports that she drinks about 0.6 - 1.2 oz of alcohol per week . She reports that she does not use drugs.  Past Medical History:  Diagnosis Date  . Carpal tunnel syndrome 2004   R  . Community acquired MRSA infection 2008  . Fibrocystic breast   . Migraine     Past  Surgical History:  Procedure Laterality Date  . DIAGNOSTIC LAPAROSCOPY  1995   LOA    Current Outpatient Prescriptions  Medication Sig Dispense Refill  . Fexofenadine HCl (ALLEGRA PO) Take by mouth as needed.     Marland Kitchen levothyroxine (SYNTHROID, LEVOTHROID) 50 MCG tablet TK 1 T PO D  3  . Multiple Vitamins-Minerals (MULTIVITAL PO) Take by mouth. Maxi one daily MVI    . SUMAtriptan (IMITREX) 100 MG tablet Take 100 mg by mouth once.   5  . topiramate (TOPAMAX) 100 MG tablet TAKE 1 AND 1/2 TABLETS BY MOUTH DAILY     No current facility-administered medications for this visit.     Family History  Problem Relation Age of Onset  . Cancer Paternal Grandmother 60    breast  . Heart disease Maternal Grandmother   . Heart disease Maternal Grandfather   . Breast cancer Mother 100    02/2013    ROS:  Pertinent items are noted in HPI.  Otherwise, a comprehensive ROS was negative.  Exam:   BP 120/80 (BP Location: Right Arm, Patient Position: Sitting, Cuff Size: Normal)   Pulse 80   Resp 16   Ht 5' 5.5" (1.664 m)   Wt 227 lb 3.2 oz (103.1 kg)   LMP 04/23/2016  BMI 37.23 kg/m     Height: 5' 5.5" (166.4 cm)  Ht Readings from Last 3 Encounters:  04/30/16 5' 5.5" (1.664 m)  12/14/14 5' 5.5" (1.664 m)  05/25/14 5' 5.25" (1.657 m)    General appearance: alert, cooperative and appears stated age Head: Normocephalic, without obvious abnormality, atraumatic Neck: no adenopathy, supple, symmetrical, trachea midline and thyroid normal to inspection and palpation Lungs: clear to auscultation bilaterally Breasts: normal appearance, no masses or tenderness Heart: regular rate and rhythm Abdomen: soft, non-tender; bowel sounds normal; no masses,  no organomegaly Extremities: extremities normal, atraumatic, no cyanosis or edema Skin: Skin color, texture, turgor normal. No rashes or lesions Lymph nodes: Cervical, supraclavicular, and axillary nodes normal. No abnormal inguinal nodes  palpated Neurologic: Grossly normal  Pelvic: External genitalia:  no lesions              Urethra:  normal appearing urethra with no masses, tenderness or lesions              Bartholins and Skenes: normal                 Vagina: normal appearing vagina with normal color and discharge, no lesions              Cervix: no lesions              Pap taken: Yes.   Bimanual Exam:  Uterus:  normal size, contour, position, consistency, mobility, non-tender              Adnexa: normal adnexa and no mass, fullness, tenderness               Rectovaginal: Confirms               Anus:  normal sphincter tone, no lesions  Chaperone was present for exam.  A:     Well Woman with normal exam  Menorrhaiga Skyla IUD placed 11/27/11. Removed 03/09/14 due to DUB Migraines. On topamax but wondering about side effects.  Has follow up with Dr. Joretta Bachelor in two weeks Hypothyroidism Left shoulder pain after a fall.  Seeing chiropractor. Renal stones  P:  Mammogram guidelines reviewed.  Pap and HR HPV obtained today Pt will return for PUS and endometrial biopsy.  Considering treatment options for her bleeding including endometrial ablation and hysterectomy Labs/vaccines with PCP yearly return annually or prni

## 2016-05-02 LAB — IPS PAP TEST WITH HPV

## 2016-05-03 ENCOUNTER — Other Ambulatory Visit: Payer: 59

## 2016-05-03 ENCOUNTER — Other Ambulatory Visit: Payer: 59 | Admitting: Obstetrics & Gynecology

## 2016-05-10 ENCOUNTER — Encounter: Payer: Self-pay | Admitting: Obstetrics & Gynecology

## 2016-05-10 ENCOUNTER — Other Ambulatory Visit: Payer: Self-pay | Admitting: Obstetrics & Gynecology

## 2016-05-10 ENCOUNTER — Ambulatory Visit (INDEPENDENT_AMBULATORY_CARE_PROVIDER_SITE_OTHER): Payer: 59

## 2016-05-10 ENCOUNTER — Ambulatory Visit (INDEPENDENT_AMBULATORY_CARE_PROVIDER_SITE_OTHER): Payer: 59 | Admitting: Obstetrics & Gynecology

## 2016-05-10 VITALS — BP 124/82 | HR 76 | Resp 14 | Ht 65.5 in | Wt 227.0 lb

## 2016-05-10 DIAGNOSIS — N920 Excessive and frequent menstruation with regular cycle: Secondary | ICD-10-CM

## 2016-05-10 DIAGNOSIS — N840 Polyp of corpus uteri: Secondary | ICD-10-CM | POA: Diagnosis not present

## 2016-05-10 DIAGNOSIS — N921 Excessive and frequent menstruation with irregular cycle: Secondary | ICD-10-CM

## 2016-05-10 NOTE — Progress Notes (Signed)
46 y.o. G43 Divorced Caucasian female here for additional evaluation of menorrhagia.  Pt had an IUD in the past with persistent spotting.  She ultimately had it removed.  She has regular cycles that last up to seven days but several days are very heavy.  She cannot wear tampons on these days due to such heavy flow.  Pt is completley sick of this.  She is here for ultrasound and biopsy and discussion of endometrial ablation vs hysterectomy for treatment.  She does not want to be on anything hormonal for treatment.     Patient's last menstrual period was 04/23/2016.  Contraception:  Not SA  Technique:  Both transabdominal and transvaginal ultrasound examinations of the pelvis were performed. Transabdominal technique was performed for global imaging of the pelvis including uterus, ovaries, adnexal regions, and pelvic cul-de-sac.  It was necessary to proceed with endovaginal exam following the abdominal ultrasound transabdominal exam to visualize the endometrium and adnexa.  Color and duplex Doppler ultrasound was utilized to evaluate blood flow to the ovaries.   FINDINGS: Uterus: 6.7 x 5.0 x 5.1cm Endometrium: appears thickened, mass difficult to r/o Adnexa:  Left: 2.5 x 2.5 x 0000000 with left follicle 1.0 x 0000000 and 2.8 x 1.4cm     Right: 2.7 x 2.1 x 2.0cm with 1.6 x [id number] follicle Cul de sac: no free fluid  SHSG:  After obtaining appropriate verbal consent from patient, the cervix was visualized using a speculum, and prepped with betadine.  A tenaculum  was applied to the cervix.  Dilation of the cervix was necessary. The catheter was passed into the uterus and sterile saline introduced, with the following findings:  At least two fundal filling defects noted measuring 12 and 40mm.  These are fundal in location.  Endometrial biopsy recommended. Verbal and written consent obtained.  Speculum placed.  Cervix visualized and cleansed with betadine prep.  A single toothed tenaculum was not applied to the  anterior lip of the cervix.  Endometrial pipelle was advanced through the cervix into the endometrial cavity without difficulty.  Pipelle passed to 7cm.  Suction applied and pipelle removed with good tissue sample obtained.  Tenculum removed.  No bleeding noted.  Patient tolerated procedure well.  All instruments removed.  Assessment:  Menorrhagia and probable endometrial polyps Failed IUD use in the past Desires for no hormonal use  Plan:  Endometrial ablation, hormonal options and hysterectomy reviewed.  Procedures, risks and benefits reviewed.  Will await biopsy results and contact pt with results.  Can then proceed with planning according to her desires.  Information provided for her to read as well.  ~30 minutes spent with patient >50% of time was in face to face discussion of above.

## 2016-05-14 ENCOUNTER — Telehealth: Payer: Self-pay | Admitting: Obstetrics & Gynecology

## 2016-05-14 NOTE — Telephone Encounter (Signed)
Call to patient regarding surgery date options. Patient is driving here to drop off FMLA forms and would like to discuss in person when she arrives.

## 2016-05-14 NOTE — Telephone Encounter (Signed)
Patient called requested to speak with Gay Filler to schedule surgery.

## 2016-05-14 NOTE — Telephone Encounter (Signed)
Patient came to office with FMLA papers for upcoming surgery. Surgery dates discussed and will plan for 06-25-16. Will schedule surgery and call her back once confirmed.

## 2016-05-15 NOTE — Telephone Encounter (Signed)
Call to patient. Advised surgery is scheduled for 06-25-16 at [facility] at 0730. Arrive at 0600 unless hospital directs different time. Surgery instruction sheet reviewed and printed copy will be mailed to patient. Advised due for mammogram in March 2018 and encouraged to proceed with scheduling.   Routing to provider for final review. Patient agreeable to disposition. Will close encounter.

## 2016-05-16 ENCOUNTER — Other Ambulatory Visit: Payer: Self-pay | Admitting: Obstetrics & Gynecology

## 2016-05-16 NOTE — Progress Notes (Signed)
Surgical orders placed

## 2016-05-25 ENCOUNTER — Other Ambulatory Visit: Payer: Self-pay | Admitting: Obstetrics & Gynecology

## 2016-05-25 DIAGNOSIS — N63 Unspecified lump in unspecified breast: Secondary | ICD-10-CM

## 2016-06-04 ENCOUNTER — Ambulatory Visit (INDEPENDENT_AMBULATORY_CARE_PROVIDER_SITE_OTHER): Payer: 59 | Admitting: Obstetrics & Gynecology

## 2016-06-04 ENCOUNTER — Encounter: Payer: Self-pay | Admitting: Obstetrics & Gynecology

## 2016-06-04 VITALS — BP 120/78 | HR 100 | Resp 14 | Ht 65.25 in | Wt 229.4 lb

## 2016-06-04 DIAGNOSIS — N920 Excessive and frequent menstruation with regular cycle: Secondary | ICD-10-CM

## 2016-06-04 DIAGNOSIS — Z531 Procedure and treatment not carried out because of patient's decision for reasons of belief and group pressure: Secondary | ICD-10-CM

## 2016-06-04 DIAGNOSIS — E039 Hypothyroidism, unspecified: Secondary | ICD-10-CM

## 2016-06-04 NOTE — Progress Notes (Addendum)
46 y.o. G63P0000 Divorced Caucasian female here to discuss definitive treatment for menorrhagia.  Pt has hx of failed progesterone IUD use.  Cycles are regular but last 7 days and can be so heavy that she cannot wear a tampon as it won't stay due to amount of bleeding she's having.  POPs, endometrial ablation, hysterectomy have all been reviewed.  She has decided to proceed with definitive management.  She does bring documentation today about being a Jehovah's witness and desiring no blood products however she is comfortable with clotting factors and platelets as well as cell salvage if needed.  Paperwork will be scanned into EPIC.  Pt had ultrasound 05/10/16 showing 7 x 5 x 5cm uterus with thickened endometrium.  Biopsy showed inactive endometrium.  Procedure discussed with patient.  Hospital stay, recovery and pain management all discussed.  Risks discussed including but not limited to bleeding, 1% risk of receiving a  transfusion, infection, 3-4% risk of bowel/bladder/ureteral/vascular injury discussed as well as possible need for additional surgery if injury does occur discussed.  DVT/PE and rare risk of death discussed.  My actual complications with prior surgeries discussed.  Vaginal cuff dehiscence discussed.  Hernia formation discussed.  Positioning and incision locations discussed.  Patient aware if pathology abnormal she may need additional treatment.  She does understand she will not be able to have children after surgery is completed.  She is very comfortable with this.  All questions answered.     Ob Hx:   Patient's last menstrual period was 05/18/2016.          Sexually active: No. Birth control: no method Last pap: 04/30/16 negative, HR HPV negative  Last MMG: 06/17/15 [place] BIRADS 3 probably benign, 12/26/15 BIRADS 3 probably benign- 6 month recall in place for 03/18  Tobacco: Never smoker   Past Surgical History:  Procedure Laterality Date  . DIAGNOSTIC LAPAROSCOPY  1995   LOA    Past  Medical History:  Diagnosis Date  . Carpal tunnel syndrome 2004   R  . Community acquired MRSA infection 2008  . Fibrocystic breast   . Kidney stones 12/2015  . Migraine     Allergies: Estring [estradiol]  Current Outpatient Prescriptions  Medication Sig Dispense Refill  . Fexofenadine HCl (ALLEGRA PO) Take by mouth as needed.     Marland Kitchen levothyroxine (SYNTHROID, LEVOTHROID) 50 MCG tablet TK 1 T PO D  3  . Multiple Vitamins-Minerals (MULTIVITAL PO) Take by mouth. Maxi one daily MVI    . SUMAtriptan (IMITREX) 100 MG tablet Take 100 mg by mouth once.   5  . topiramate (TOPAMAX) 100 MG tablet TAKE 1 AND 1/2 TABLETS BY MOUTH DAILY     No current facility-administered medications for this visit.     ROS: A comprehensive review of systems was negative.  Exam:    BP 120/78 (BP Location: Right Arm, Patient Position: Sitting, Cuff Size: Normal)   Pulse 100   Resp 14   Ht 5' 5.25" (1.657 m)   Wt 229 lb 6.4 oz (104.1 kg)   LMP 05/18/2016   BMI 37.88 kg/m   General appearance: alert and cooperative Head: Normocephalic, without obvious abnormality, atraumatic Neck: no adenopathy, supple, symmetrical, trachea midline and thyroid not enlarged, symmetric, no tenderness/mass/nodules Lungs: clear to auscultation bilaterally Heart: regular rate and rhythm, S1, S2 normal, no murmur, click, rub or gallop Abdomen: soft, non-tender; bowel sounds normal; no masses,  no organomegaly Extremities: extremities normal, atraumatic, no cyanosis or edema Skin: Skin color, texture,  turgor normal. No rashes or lesions Lymph nodes: Cervical, supraclavicular, and axillary nodes normal. no inguinal nodes palpated Neurologic: Grossly normal  Pelvic: External genitalia:  no lesions              Urethra: normal appearing urethra with no masses, tenderness or lesions              Bartholins and Skenes: Bartholin's, Urethra, Skene's normal                 Vagina: normal appearing vagina with normal color and  discharge, no lesions              Cervix: normal appearance              Pap taken: No.        Bimanual Exam:  Uterus:  uterus is normal size, shape, consistency and nontender                                      Adnexa:    normal adnexa in size, nontender and no masses                                      Rectovaginal: Deferred                                      Anus:  normal sphincter tone, no lesions  A: menorrhagia, desirous of defintive treatment  Jehovah's witness Hypothyroidism Obesity with BMI 37  P:  Total laparoscopic hysterectomy with bilateral salpingectomy, possible BSO planned Will plan to use Motrin and Percocet post operatively for pain control Medications/Vitamins reviewed.  Pt knows needs to stop any ASA products. Hysterectomy brochure given for pre and post op instructions.  ~30 minutes spent with patient >50% of time was in face to face discussion of above.

## 2016-06-08 ENCOUNTER — Ambulatory Visit
Admission: RE | Admit: 2016-06-08 | Discharge: 2016-06-08 | Disposition: A | Discharge status: Home / Self Care | Payer: 59 | Source: Ambulatory Visit | Attending: Obstetrics & Gynecology | Admitting: Obstetrics & Gynecology

## 2016-06-08 DIAGNOSIS — N63 Unspecified lump in unspecified breast: Secondary | ICD-10-CM

## 2016-06-08 DIAGNOSIS — Z531 Procedure and treatment not carried out because of patient's decision for reasons of belief and group pressure: Secondary | ICD-10-CM | POA: Insufficient documentation

## 2016-06-08 DIAGNOSIS — IMO0001 Reserved for inherently not codable concepts without codable children: Secondary | ICD-10-CM | POA: Insufficient documentation

## 2016-06-08 DIAGNOSIS — E039 Hypothyroidism, unspecified: Secondary | ICD-10-CM | POA: Insufficient documentation

## 2016-06-08 NOTE — Patient Instructions (Signed)
Your procedure is scheduled on:  Monday, June 25, 2016  Enter through the Main Entrance of [facility] at:  6:00 AM  Pick up the phone at the desk and dial [phone].  Call this number if you have problems the morning of surgery: [phone].  Remember: Do NOT eat food or drink after: Midnight Sunday, June 24, 2016  Take these medicines the morning of surgery with a SIP OF WATER:  Levothyroxine  Bring Asthma Inhaler day of surgery  Stop ALL herbal medications at this time  Do NOT smoke the day of surgery.  Do NOT wear jewelry (body piercing), metal hair clips/bobby pins, make-up, or nail polish. Do NOT wear lotions, powders, or perfumes.  You may wear deodorant. Do NOT shave for 48 hours prior to surgery. Do NOT bring valuables to the hospital. Contacts, dentures, or bridgework may not be worn into surgery.  Leave suitcase in car.  After surgery it may be brought to your room.  For patients admitted to the hospital, checkout time is 11:00 AM the day of discharge.   Bring a copy of your healthcare power of attorney and living will documents.  **Effective Friday, Jan. 12, 2018, [facility] will implement no hospital visitations from children age 24 and younger due to a steady increase in flu activity in our community and hospitals. **

## 2016-06-11 ENCOUNTER — Encounter (HOSPITAL_COMMUNITY)
Admission: RE | Admit: 2016-06-11 | Discharge: 2016-06-11 | Disposition: A | Discharge status: Home / Self Care | Payer: 59 | Source: Ambulatory Visit | Attending: Obstetrics & Gynecology | Admitting: Obstetrics & Gynecology

## 2016-06-11 ENCOUNTER — Encounter (HOSPITAL_COMMUNITY): Payer: Self-pay

## 2016-06-11 DIAGNOSIS — N920 Excessive and frequent menstruation with regular cycle: Secondary | ICD-10-CM | POA: Insufficient documentation

## 2016-06-11 DIAGNOSIS — N840 Polyp of corpus uteri: Secondary | ICD-10-CM | POA: Diagnosis not present

## 2016-06-11 DIAGNOSIS — Z01812 Encounter for preprocedural laboratory examination: Secondary | ICD-10-CM | POA: Insufficient documentation

## 2016-06-11 HISTORY — DX: Hypothyroidism, unspecified: E03.9

## 2016-06-11 HISTORY — DX: Dyspnea, unspecified: R06.00

## 2016-06-11 HISTORY — DX: Pain in left shoulder: M25.512

## 2016-06-11 LAB — CBC
HCT: 34.8 % — ABNORMAL LOW (ref 36.0–46.0)
HEMOGLOBIN: 11.2 g/dL — AB (ref 12.0–15.0)
MCH: 24.7 pg — AB (ref 26.0–34.0)
MCHC: 32.2 g/dL (ref 30.0–36.0)
MCV: 76.7 fL — AB (ref 78.0–100.0)
Platelets: 263 10*3/uL (ref 150–400)
RBC: 4.54 MIL/uL (ref 3.87–5.11)
RDW: 14.8 % (ref 11.5–15.5)
WBC: 6.6 10*3/uL (ref 4.0–10.5)

## 2016-06-11 LAB — SURGICAL PCR SCREEN
MRSA, PCR: NEGATIVE
Staphylococcus aureus: NEGATIVE

## 2016-06-21 DIAGNOSIS — Z0289 Encounter for other administrative examinations: Secondary | ICD-10-CM

## 2016-06-22 NOTE — Anesthesia Preprocedure Evaluation (Addendum)
Anesthesia Evaluation  Patient identified by MRN, date of birth, ID band Patient awake    Reviewed: Allergy & Precautions, H&P , Patient's Chart, lab work & pertinent test results, reviewed documented beta blocker date and time   Airway Mallampati: II  TM Distance: >3 FB Neck ROM: full    Dental no notable dental hx.    Pulmonary    Pulmonary exam normal breath sounds clear to auscultation       Cardiovascular  Rhythm:regular Rate:Normal     Neuro/Psych    GI/Hepatic   Endo/Other  Morbid obesity  Renal/GU      Musculoskeletal   Abdominal   Peds  Hematology   Anesthesia Other Findings Inhaler?  Reproductive/Obstetrics                            Anesthesia Physical Anesthesia Plan  ASA: II  Anesthesia Plan: General   Post-op Pain Management:    Induction: Intravenous  Airway Management Planned: Oral ETT  Additional Equipment:   Intra-op Plan:   Post-operative Plan: Extubation in OR  Informed Consent: I have reviewed the patients History and Physical, chart, labs and discussed the procedure including the risks, benefits and alternatives for the proposed anesthesia with the patient or authorized representative who has indicated his/her understanding and acceptance.   Dental Advisory Given and Dental advisory given  Plan Discussed with: CRNA and Surgeon  Anesthesia Plan Comments: (  Discussed general anesthesia, including possible nausea, instrumentation of airway, sore throat,pulmonary aspiration, etc. I asked if the were any outstanding questions, or  concerns before we proceeded.)        Anesthesia Quick Evaluation

## 2016-06-25 ENCOUNTER — Encounter (HOSPITAL_COMMUNITY)
Admission: RE | Disposition: A | Discharge status: Home / Self Care | Payer: Self-pay | Source: Ambulatory Visit | Attending: Obstetrics & Gynecology

## 2016-06-25 ENCOUNTER — Observation Stay (HOSPITAL_COMMUNITY)
Admission: RE | Admit: 2016-06-25 | Discharge: 2016-06-25 | Disposition: A | Discharge status: Home / Self Care | Payer: 59 | Source: Ambulatory Visit | Attending: Obstetrics & Gynecology | Admitting: Obstetrics & Gynecology

## 2016-06-25 ENCOUNTER — Encounter (HOSPITAL_COMMUNITY): Payer: Self-pay | Admitting: *Deleted

## 2016-06-25 ENCOUNTER — Ambulatory Visit (HOSPITAL_COMMUNITY): Payer: 59 | Admitting: Anesthesiology

## 2016-06-25 DIAGNOSIS — D649 Anemia, unspecified: Secondary | ICD-10-CM | POA: Diagnosis not present

## 2016-06-25 DIAGNOSIS — N840 Polyp of corpus uteri: Secondary | ICD-10-CM | POA: Diagnosis not present

## 2016-06-25 DIAGNOSIS — N920 Excessive and frequent menstruation with regular cycle: Secondary | ICD-10-CM | POA: Diagnosis present

## 2016-06-25 DIAGNOSIS — Z79899 Other long term (current) drug therapy: Secondary | ICD-10-CM | POA: Diagnosis not present

## 2016-06-25 DIAGNOSIS — Z791 Long term (current) use of non-steroidal anti-inflammatories (NSAID): Secondary | ICD-10-CM | POA: Insufficient documentation

## 2016-06-25 DIAGNOSIS — D259 Leiomyoma of uterus, unspecified: Secondary | ICD-10-CM | POA: Diagnosis not present

## 2016-06-25 DIAGNOSIS — Z6837 Body mass index (BMI) 37.0-37.9, adult: Secondary | ICD-10-CM | POA: Diagnosis not present

## 2016-06-25 DIAGNOSIS — G5601 Carpal tunnel syndrome, right upper limb: Secondary | ICD-10-CM | POA: Insufficient documentation

## 2016-06-25 DIAGNOSIS — E039 Hypothyroidism, unspecified: Secondary | ICD-10-CM | POA: Insufficient documentation

## 2016-06-25 DIAGNOSIS — Z7951 Long term (current) use of inhaled steroids: Secondary | ICD-10-CM | POA: Diagnosis not present

## 2016-06-25 HISTORY — PX: CYSTOSCOPY: SHX5120

## 2016-06-25 HISTORY — PX: LAPAROSCOPIC BILATERAL SALPINGECTOMY: SHX5889

## 2016-06-25 HISTORY — PX: LAPAROSCOPIC HYSTERECTOMY: SHX1926

## 2016-06-25 LAB — PREGNANCY, URINE: Preg Test, Ur: NEGATIVE

## 2016-06-25 LAB — HEMOGLOBIN: HEMOGLOBIN: 10.8 g/dL — AB (ref 12.0–15.0)

## 2016-06-25 SURGERY — HYSTERECTOMY, TOTAL, LAPAROSCOPIC
Anesthesia: General | Site: Urethra

## 2016-06-25 MED ORDER — MIDAZOLAM HCL 2 MG/2ML IJ SOLN
INTRAMUSCULAR | Status: AC
  Filled 2016-06-25: qty 2

## 2016-06-25 MED ORDER — ACETAMINOPHEN 160 MG/5ML PO SOLN
975.0000 mg | Freq: Four times a day (QID) | ORAL | Status: DC | PRN
  Administered 2016-06-25: 975 mg via ORAL

## 2016-06-25 MED ORDER — ALUM & MAG HYDROXIDE-SIMETH 200-200-20 MG/5ML PO SUSP: 30.0000 mL | ORAL | Status: DC | PRN

## 2016-06-25 MED ORDER — PROPOFOL 10 MG/ML IV BOLUS
INTRAVENOUS | Status: AC
  Filled 2016-06-25: qty 20

## 2016-06-25 MED ORDER — SCOPOLAMINE 1 MG/3DAYS TD PT72
MEDICATED_PATCH | TRANSDERMAL | Status: AC
  Administered 2016-06-25: 1.5 mg via TRANSDERMAL
  Filled 2016-06-25: qty 1

## 2016-06-25 MED ORDER — OXYCODONE-ACETAMINOPHEN 5-325 MG PO TABS: 1.0000 | ORAL_TABLET | ORAL | Status: DC | PRN

## 2016-06-25 MED ORDER — SUGAMMADEX SODIUM 200 MG/2ML IV SOLN
INTRAVENOUS | Status: DC | PRN
  Administered 2016-06-25: 208.6 mg via INTRAVENOUS

## 2016-06-25 MED ORDER — DEXAMETHASONE SODIUM PHOSPHATE 4 MG/ML IJ SOLN
INTRAMUSCULAR | Status: AC
  Filled 2016-06-25: qty 1

## 2016-06-25 MED ORDER — HYDROMORPHONE HCL 1 MG/ML IJ SOLN
INTRAMUSCULAR | Status: AC
  Filled 2016-06-25: qty 1

## 2016-06-25 MED ORDER — ACETAMINOPHEN 325 MG PO TABS: 650.0000 mg | ORAL_TABLET | ORAL | Status: DC | PRN

## 2016-06-25 MED ORDER — MORPHINE SULFATE (PF) 4 MG/ML IV SOLN: 1.0000 mg | INTRAVENOUS | Status: DC | PRN

## 2016-06-25 MED ORDER — HYDROMORPHONE HCL 1 MG/ML IJ SOLN
INTRAMUSCULAR | Status: DC | PRN
  Administered 2016-06-25 (×2): 0.5 mg via INTRAVENOUS

## 2016-06-25 MED ORDER — DEXAMETHASONE SODIUM PHOSPHATE 4 MG/ML IJ SOLN
INTRAMUSCULAR | Status: DC | PRN
  Administered 2016-06-25: 4 mg via INTRAVENOUS

## 2016-06-25 MED ORDER — CEFOTETAN DISODIUM-DEXTROSE 2-2.08 GM-% IV SOLR
2.0000 g | INTRAVENOUS | Status: AC
  Administered 2016-06-25: 2 g via INTRAVENOUS

## 2016-06-25 MED ORDER — KETOROLAC TROMETHAMINE 30 MG/ML IJ SOLN
30.0000 mg | Freq: Four times a day (QID) | INTRAMUSCULAR | Status: DC
  Administered 2016-06-25: 30 mg via INTRAVENOUS
  Filled 2016-06-25: qty 1

## 2016-06-25 MED ORDER — LIDOCAINE HCL 1 % IJ SOLN
INTRAMUSCULAR | Status: AC
  Filled 2016-06-25: qty 20

## 2016-06-25 MED ORDER — ROCURONIUM BROMIDE 100 MG/10ML IV SOLN
INTRAVENOUS | Status: DC | PRN
  Administered 2016-06-25: 40 mg via INTRAVENOUS
  Administered 2016-06-25: 10 mg via INTRAVENOUS

## 2016-06-25 MED ORDER — TOPIRAMATE 25 MG PO TABS
150.0000 mg | ORAL_TABLET | Freq: Every day | ORAL | Status: DC
  Filled 2016-06-25: qty 2

## 2016-06-25 MED ORDER — KETOROLAC TROMETHAMINE 30 MG/ML IJ SOLN: 30.0000 mg | Freq: Four times a day (QID) | INTRAMUSCULAR | Status: DC

## 2016-06-25 MED ORDER — ALBUTEROL SULFATE (2.5 MG/3ML) 0.083% IN NEBU: 2.5000 mg | INHALATION_SOLUTION | Freq: Four times a day (QID) | RESPIRATORY_TRACT | Status: DC | PRN

## 2016-06-25 MED ORDER — SCOPOLAMINE 1 MG/3DAYS TD PT72
1.0000 | MEDICATED_PATCH | Freq: Once | TRANSDERMAL | Status: DC
  Administered 2016-06-25: 1.5 mg via TRANSDERMAL

## 2016-06-25 MED ORDER — MENTHOL 3 MG MT LOZG: 1.0000 | LOZENGE | OROMUCOSAL | Status: DC | PRN

## 2016-06-25 MED ORDER — PROPOFOL 10 MG/ML IV BOLUS
INTRAVENOUS | Status: DC | PRN
  Administered 2016-06-25: 150 mg via INTRAVENOUS

## 2016-06-25 MED ORDER — FENTANYL CITRATE (PF) 250 MCG/5ML IJ SOLN
INTRAMUSCULAR | Status: AC
  Filled 2016-06-25: qty 5

## 2016-06-25 MED ORDER — SIMETHICONE 80 MG PO CHEW: 80.0000 mg | CHEWABLE_TABLET | Freq: Four times a day (QID) | ORAL | Status: DC | PRN

## 2016-06-25 MED ORDER — SUGAMMADEX SODIUM 500 MG/5ML IV SOLN
INTRAVENOUS | Status: AC
  Filled 2016-06-25: qty 5

## 2016-06-25 MED ORDER — OXYCODONE-ACETAMINOPHEN 5-325 MG PO TABS: 1.0000 | ORAL_TABLET | Freq: Four times a day (QID) | ORAL | 0 refills | Status: DC | PRN

## 2016-06-25 MED ORDER — DEXTROSE-NACL 5-0.45 % IV SOLN
INTRAVENOUS | Status: DC
  Administered 2016-06-25: 13:00:00 via INTRAVENOUS

## 2016-06-25 MED ORDER — BUPIVACAINE HCL (PF) 0.25 % IJ SOLN
INTRAMUSCULAR | Status: DC | PRN
  Administered 2016-06-25: 15 mL

## 2016-06-25 MED ORDER — DIPHENHYDRAMINE HCL 50 MG/ML IJ SOLN
12.5000 mg | Freq: Once | INTRAMUSCULAR | Status: AC
  Administered 2016-06-25: 12.5 mg via INTRAVENOUS

## 2016-06-25 MED ORDER — KETOROLAC TROMETHAMINE 30 MG/ML IJ SOLN
INTRAMUSCULAR | Status: DC | PRN
  Administered 2016-06-25: 30 mg via INTRAVENOUS

## 2016-06-25 MED ORDER — FENTANYL CITRATE (PF) 100 MCG/2ML IJ SOLN
INTRAMUSCULAR | Status: DC | PRN
  Administered 2016-06-25 (×2): 100 ug via INTRAVENOUS
  Administered 2016-06-25: 50 ug via INTRAVENOUS
  Administered 2016-06-25: 100 ug via INTRAVENOUS

## 2016-06-25 MED ORDER — SCOPOLAMINE 1 MG/3DAYS TD PT72
MEDICATED_PATCH | TRANSDERMAL | Status: AC
  Filled 2016-06-25: qty 1

## 2016-06-25 MED ORDER — LEVOTHYROXINE SODIUM 50 MCG PO TABS
50.0000 ug | ORAL_TABLET | Freq: Every day | ORAL | Status: DC
  Filled 2016-06-25: qty 1

## 2016-06-25 MED ORDER — SODIUM CHLORIDE 0.9 % IJ SOLN
INTRAMUSCULAR | Status: AC
  Filled 2016-06-25: qty 50

## 2016-06-25 MED ORDER — DIPHENHYDRAMINE HCL 50 MG/ML IJ SOLN
INTRAMUSCULAR | Status: AC
  Administered 2016-06-25: 12.5 mg via INTRAVENOUS
  Filled 2016-06-25: qty 1

## 2016-06-25 MED ORDER — ROPIVACAINE HCL 5 MG/ML IJ SOLN
INTRAMUSCULAR | Status: AC
  Filled 2016-06-25: qty 30

## 2016-06-25 MED ORDER — LACTATED RINGERS IR SOLN
Status: DC | PRN
  Administered 2016-06-25: 3000 mL

## 2016-06-25 MED ORDER — BUPIVACAINE HCL (PF) 0.25 % IJ SOLN
INTRAMUSCULAR | Status: AC
  Filled 2016-06-25: qty 60

## 2016-06-25 MED ORDER — PANTOPRAZOLE SODIUM 40 MG IV SOLR
40.0000 mg | Freq: Every day | INTRAVENOUS | Status: DC
  Filled 2016-06-25: qty 40

## 2016-06-25 MED ORDER — HYDROMORPHONE HCL 1 MG/ML IJ SOLN
0.2500 mg | INTRAMUSCULAR | Status: DC | PRN
  Administered 2016-06-25: 0.25 mg via INTRAVENOUS

## 2016-06-25 MED ORDER — ROCURONIUM BROMIDE 100 MG/10ML IV SOLN
INTRAVENOUS | Status: AC
  Filled 2016-06-25: qty 1

## 2016-06-25 MED ORDER — ACETAMINOPHEN 160 MG/5ML PO SOLN
ORAL | Status: AC
  Filled 2016-06-25: qty 20.3

## 2016-06-25 MED ORDER — ONDANSETRON HCL 4 MG/2ML IJ SOLN
INTRAMUSCULAR | Status: AC
  Filled 2016-06-25: qty 2

## 2016-06-25 MED ORDER — LIDOCAINE HCL (CARDIAC) 20 MG/ML IV SOLN
INTRAVENOUS | Status: AC
  Filled 2016-06-25: qty 5

## 2016-06-25 MED ORDER — IBUPROFEN 800 MG PO TABS: 800.0000 mg | ORAL_TABLET | Freq: Three times a day (TID) | ORAL | 0 refills | Status: DC | PRN

## 2016-06-25 MED ORDER — SODIUM CHLORIDE 0.9 % IV SOLN
INTRAVENOUS | Status: DC | PRN
  Administered 2016-06-25 (×2)

## 2016-06-25 MED ORDER — FENTANYL CITRATE (PF) 100 MCG/2ML IJ SOLN
INTRAMUSCULAR | Status: AC
  Filled 2016-06-25: qty 2

## 2016-06-25 MED ORDER — LIDOCAINE HCL (CARDIAC) 20 MG/ML IV SOLN
INTRAVENOUS | Status: DC | PRN
  Administered 2016-06-25: 100 mg via INTRAVENOUS

## 2016-06-25 MED ORDER — ENOXAPARIN SODIUM 40 MG/0.4ML ~~LOC~~ SOLN
40.0000 mg | SUBCUTANEOUS | Status: AC
  Administered 2016-06-25: 40 mg via SUBCUTANEOUS
  Filled 2016-06-25: qty 0.4

## 2016-06-25 MED ORDER — SUMATRIPTAN SUCCINATE 100 MG PO TABS
100.0000 mg | ORAL_TABLET | ORAL | Status: DC | PRN
  Filled 2016-06-25: qty 1

## 2016-06-25 MED ORDER — CEFOTETAN DISODIUM-DEXTROSE 2-2.08 GM-% IV SOLR
INTRAVENOUS | Status: AC
  Filled 2016-06-25: qty 50

## 2016-06-25 MED ORDER — MIDAZOLAM HCL 2 MG/2ML IJ SOLN
INTRAMUSCULAR | Status: DC | PRN
  Administered 2016-06-25: 2 mg via INTRAVENOUS

## 2016-06-25 MED ORDER — LACTATED RINGERS IV SOLN
INTRAVENOUS | Status: DC
  Administered 2016-06-25 (×3): via INTRAVENOUS

## 2016-06-25 SURGICAL SUPPLY — 64 items
APPLICATOR ARISTA FLEXITIP XL (MISCELLANEOUS) ×5 IMPLANT
BENZOIN TINCTURE PRP APPL 2/3 (GAUZE/BANDAGES/DRESSINGS) IMPLANT
CABLE HIGH FREQUENCY MONO STRZ (ELECTRODE) IMPLANT
CATH ROBINSON RED A/P 16FR (CATHETERS) IMPLANT
CLOSURE WOUND 1/2 X4 (GAUZE/BANDAGES/DRESSINGS)
CLOTH BEACON ORANGE TIMEOUT ST (SAFETY) ×5 IMPLANT
COVER LIGHT HANDLE  1/PK (MISCELLANEOUS) ×2
COVER LIGHT HANDLE 1/PK (MISCELLANEOUS) ×3 IMPLANT
COVER MAYO STAND STRL (DRAPES) ×5 IMPLANT
DERMABOND ADVANCED (GAUZE/BANDAGES/DRESSINGS) ×2
DERMABOND ADVANCED .7 DNX12 (GAUZE/BANDAGES/DRESSINGS) ×3 IMPLANT
DISSECTOR BLUNT TIP ENDO 5MM (MISCELLANEOUS) ×5 IMPLANT
DRSG OPSITE POSTOP 3X4 (GAUZE/BANDAGES/DRESSINGS) IMPLANT
DURAPREP 26ML APPLICATOR (WOUND CARE) ×5 IMPLANT
GLOVE BIOGEL PI IND STRL 7.0 (GLOVE) ×12 IMPLANT
GLOVE BIOGEL PI INDICATOR 7.0 (GLOVE) ×8
GLOVE ECLIPSE 6.5 STRL STRAW (GLOVE) ×10 IMPLANT
GOWN STRL REUS W/TWL LRG LVL3 (GOWN DISPOSABLE) ×20 IMPLANT
HEMOSTAT ARISTA ABSORB 3G PWDR (MISCELLANEOUS) ×5 IMPLANT
LIGASURE VESSEL 5MM BLUNT TIP (ELECTROSURGICAL) ×4 IMPLANT
NEEDLE INSUFFLATION 120MM (ENDOMECHANICALS) ×5 IMPLANT
NS IRRIG 1000ML POUR BTL (IV SOLUTION) ×5 IMPLANT
OCCLUDER COLPOPNEUMO (BALLOONS) ×5 IMPLANT
PACK LAPAROSCOPY BASIN (CUSTOM PROCEDURE TRAY) ×5 IMPLANT
PACK TRENDGUARD 450 HYBRID PRO (MISCELLANEOUS) ×3 IMPLANT
PACK TRENDGUARD 600 HYBRD PROC (MISCELLANEOUS) IMPLANT
POUCH LAPAROSCOPIC INSTRUMENT (MISCELLANEOUS) ×5 IMPLANT
POUCH SPECIMEN RETRIEVAL 10MM (ENDOMECHANICALS) IMPLANT
PROTECTOR NERVE ULNAR (MISCELLANEOUS) ×10 IMPLANT
SCISSORS LAP 5X35 DISP (ENDOMECHANICALS) IMPLANT
SEALER TISSUE G2 CVD JAW 35 (ENDOMECHANICALS) IMPLANT
SEALER TISSUE G2 CVD JAW 45CM (ENDOMECHANICALS)
SET CYSTO W/LG BORE CLAMP LF (SET/KITS/TRAYS/PACK) ×5 IMPLANT
SET IRRIG TUBING LAPAROSCOPIC (IRRIGATION / IRRIGATOR) ×5 IMPLANT
SET TRI-LUMEN FLTR TB AIRSEAL (TUBING) ×5 IMPLANT
SHEARS HARMONIC ACE PLUS 36CM (ENDOMECHANICALS) IMPLANT
SLEEVE ADV FIXATION 5X100MM (TROCAR) IMPLANT
SOLUTION ELECTROLUBE (MISCELLANEOUS) ×5 IMPLANT
STRIP CLOSURE SKIN 1/2X4 (GAUZE/BANDAGES/DRESSINGS) IMPLANT
SUT VIC AB 0 CT1 27 (SUTURE) ×4
SUT VIC AB 0 CT1 27XBRD ANBCTR (SUTURE) ×6 IMPLANT
SUT VICRYL 0 UR6 27IN ABS (SUTURE) ×5 IMPLANT
SUT VICRYL 4-0 PS2 18IN ABS (SUTURE) ×5 IMPLANT
SUT VLOC 180 0 9IN  GS21 (SUTURE)
SUT VLOC 180 0 9IN GS21 (SUTURE) IMPLANT
SYR 30ML LL (SYRINGE) ×5 IMPLANT
SYR 50ML LL SCALE MARK (SYRINGE) ×5 IMPLANT
SYRINGE 10CC LL (SYRINGE) ×5 IMPLANT
SYSTEM CARTER THOMASON II (TROCAR) IMPLANT
TIP UTERINE 5.1X6CM LAV DISP (MISCELLANEOUS) IMPLANT
TIP UTERINE 6.7X10CM GRN DISP (MISCELLANEOUS) IMPLANT
TIP UTERINE 6.7X6CM WHT DISP (MISCELLANEOUS) IMPLANT
TIP UTERINE 6.7X8CM BLUE DISP (MISCELLANEOUS) ×5 IMPLANT
TOWEL OR 17X24 6PK STRL BLUE (TOWEL DISPOSABLE) ×10 IMPLANT
TRAY FOLEY CATH SILVER 14FR (SET/KITS/TRAYS/PACK) ×5 IMPLANT
TRENDGUARD 450 HYBRID PRO PACK (MISCELLANEOUS) ×5
TRENDGUARD 600 HYBRID PROC PK (MISCELLANEOUS)
TROCAR ADV FIXATION 5X100MM (TROCAR) ×5 IMPLANT
TROCAR PORT AIRSEAL 5X120 (TROCAR) ×5 IMPLANT
TROCAR XCEL NON BLADE 8MM B8LT (ENDOMECHANICALS) ×5 IMPLANT
TROCAR XCEL NON-BLD 11X100MML (ENDOMECHANICALS) IMPLANT
TROCAR XCEL NON-BLD 5MMX100MML (ENDOMECHANICALS) ×5 IMPLANT
WARMER LAPAROSCOPE (MISCELLANEOUS) ×5 IMPLANT
WATER STERILE IRR 1000ML POUR (IV SOLUTION) IMPLANT

## 2016-06-25 NOTE — Op Note (Signed)
06/25/2016  10:26 AM  PATIENT:  Jane Hill  46 y.o. female  PRE-OPERATIVE DIAGNOSIS:  menorrhagia, endometrial polyp, anemia, Jehovah's witness  POST-OPERATIVE DIAGNOSIS:  MENORRHAGIA,ENDOMETRIAL POLYP, ANEMIA, REFUSAL OF BLOOD PRODUCTS  PROCEDURE:  Procedure(s): HYSTERECTOMY TOTAL LAPAROSCOPIC LAPAROSCOPIC BILATERAL SALPINGECTOMY Poss BSO CYSTOSCOPY  SURGEON:  Nelwyn Hebdon SUZANNE  ASSISTANTS: DR. Josefa Half   ANESTHESIA:   general  ESTIMATED BLOOD LOSS: 50cc  BLOOD ADMINISTERED:none   FLUIDS: 2000ccLR  UOP: 75cc clear UOP  SPECIMEN:  Uterus, cervix, bilateral tubes  DISPOSITION OF SPECIMEN:  PATHOLOGY  FINDINGS: normal pelvis and upper abdomen  DESCRIPTION OF OPERATION: Patient is taken to the operating room. She is placed in the supine position. She is a running IV in place. Informed consent was present on the chart. SCDs on her lower extremities and functioning properly. General endotracheal anesthesia was administered by the anesthesia staff without difficulty. Dr. Lyndle Herrlich oversaw case. Once adequate anesthesia was confirmed the legs are placed in the low lithotomy position in [place]. Her arms were tucked by the side.   Dura prep was then used to prep the abdomen and Betadine was used to prep the inner thighs, perineum and vagina. Once 3 minutes had past the patient was draped in a normal standard fashion. The legs were lifted to the high lithotomy position. The cervix was visualized by placing a heavy weighted speculum in the posterior aspect of the vagina and using a curved Deaver retractor to the retract anteriorly. The anterior lip of the cervix was grasped with single-tooth tenaculum.  The cervix sounded to 8 cm. Pratt dilators were used to dilate the cervix up to a #21. A RUMI uterine manipulator was obtained. A #8 disposable tip was placed on the RUMI manipulator as well as a small, silver KOH ring. This was passed through the cervix and the bulb of  the disposable tip was inflated with 10 cc of normal saline. There was a good fit of the KOH ring around the cervix. The tenaculum was removed. There is also good manipulation of the uterus. The speculum and retractor were removed as well. A Foley catheter was placed to straight drain. The concentrated urine was noted. Legs were lowered to the low lithotomy position and attention was turned the abdomen.  The umbilicus was everted.  A Veress needle was obtained. Syringe of sterile saline was placed on a open Veress needle.  This was passed into the umbilicus until just when the fluid started to drip.  Then low flow CO2 gas was attached the needle and the pneumoperitoneum was achieved without difficulty. Once four liters of gas was in the abdomen the Veress needle was removed and a 5 millimeter non-bladed Optiview trocar and port were passed directly to the abdomen. The laparoscope was then used to confirm intraperitoneal placement.   Ovaries were normal.  Locations for RLQ and LLQ ports were noted by transillumination of the abdominal wall.  0.25% marcaine was used to anesthetize the skin.  108mm skin incisions were made and then 64mm bladed ports were placed.  Finally a midline incision was made about 4 cm above the pubic symphysis.  The skin was incised about 1cm and a non-bladed 8 port was placed with direct visualization of the laparoscope.    Ureters were identifies.  Attention was turned to the left side. With uterus on stretch the left tube was excised off the ovary and mesosalpinx was dissected to free the tube. Then the left utero-ovarian pedicle was serially clamped cauterized  and incised using the ligasure device. Left round ligament was serially clamped cauterized and incised. The anterior and posterior peritoneum of the inferior leaf of the broad ligament were opened. The beginning of the baldder flap was created.  The bladder was taken down below the level of the KOH ring.   Attention was turned the  right side.  The uterus was placed on stretch to the opposite side.  The tube was excised off the ovary using sharp dissection a bipolar cautery.  The mesosalpinx was incised freeing the tube. Then the right uterine ovarian pedicle was serially clamped cauterized and incised. Next the right round ligament was serially clamped cauterized and incised. The anterior posterior peritoneum of the inferiorly for the broad ligament were opened. The anterior peritoneum was carried across to the dissection on the left side. The remainder of the bladder flap was created using sharp dissection. The bladder was well below the level of the KOH ring. The right uterine artery skeletonized. Then the right uterine artery, above the level of the KOH ring, was serially clamped cauterized and incised. Attention was turned back to the left and the left uterine artery skeletonized and then just superior to the KOH ring this vessel was serially clamped, cauterized, and incised.  The uterus was devascularized at this point.  The colpotomy was performed a starting in the midline and using a harmonic scalpel with the inferior edge of the open blade  This was carried around a circumferential fashion until the vaginal mucosa was completely incised in the specimen was freed.  The specimen was then delivered into the vagina.  A vaginal occlusive device was used to maintain the pneumoperitoneum  Instruments were changed with a needle driver and Kobra graspers.  Using a 9 inch V. lock suture, the cuff was closed by incorporating the anterior and posterior vaginal mucosa in each stitch. This was carried across all the way to the left corner and a running fashion. Two stitches were brought back towards the midline and the suture was cut flush with the vagina. The needle was brought out the pelvis. The pelvis was irrigated. All pedicles were inspected. No bleeding was noted. Arista was placed along the cuff.  Pneumoperitoneum was relieved to ensure  no bleeding.  There was excellent hemostasis.  The RLQ port and suprapubic port was removed.  The pneumoperitoneum was relieved.  The patient was taken out of Trendelenburg positioning.  Several deep breaths were given to the patient's trying to any gas the abdomen and Airseal was discontinued removing the final air from the abdomen.  Last two ports were removed.   The skin was then closed with subcuticular stitches of 3-0 Vicryl. The skin was cleansed and Dermabond was applied. Attention was then turned the vagina and the cuff was inspected. No bleeding was noted. The anterior posterior vaginal mucosa was incorporated in each stitch. The Foley catheter was removed.  Cystoscopy was performed.  No sutures or bladder injuries were noted.  Ureters were noted with normal urine jets from each one was seen.  Foley was left out.  Sponge, lap, needle, initially counts were correct x2. Patient tolerated the procedure very well. She was awakened from anesthesia, extubated and taken to recovery in stable condition.   COUNTS:  YES  PLAN OF CARE: Transfer to PACU

## 2016-06-25 NOTE — Progress Notes (Signed)
Subjective: Patient reports she feels really good.  Excellent pain control.  No nausea.  Eaten.  Voided.  Passed flatus.  Has walked.  Objective: I have reviewed patient's vital signs, intake and output, medications and labs.  General: alert and cooperative Resp: clear to auscultation bilaterally Cardio: regular rate and rhythm, S1, S2 normal, no murmur, click, rub or gallop GI: soft, non-tender; bowel sounds normal; no masses,  no organomegaly and incision: clean, dry and intact Extremities: extremities normal, atraumatic, no cyanosis or edema Vaginal Bleeding: none   Assessment/Plan: S/p TLH, bilateral salpingectomy, cystoscopy, doing well.  Plan D/C home.   LOS: 1 day    Jane Hill 06/25/2016, 6:18 PM

## 2016-06-25 NOTE — Addendum Note (Signed)
Addendum  created 06/25/16 1632 by Hewitt Blade, CRNA   Sign clinical note

## 2016-06-25 NOTE — Anesthesia Postprocedure Evaluation (Signed)
Anesthesia Post Note  Patient: Jane Hill  Procedure(s) Performed: Procedure(s) (LRB): HYSTERECTOMY TOTAL LAPAROSCOPIC (N/A) LAPAROSCOPIC BILATERAL SALPINGECTOMY Poss BSO (Bilateral) CYSTOSCOPY (N/A)  Patient location during evaluation: [facility] Anesthesia Type: General Level of consciousness: awake and alert and oriented Pain management: pain level controlled Vital Signs Assessment: post-procedure vital signs reviewed and stable Respiratory status: spontaneous breathing and nonlabored ventilation Cardiovascular status: stable Postop Assessment: no signs of nausea or vomiting and adequate PO intake Anesthetic complications: no        Last Vitals:  Vitals:   06/25/16 1245 06/25/16 1535  BP: 118/70 117/68  Pulse: 66 60  Resp: 14 20  Temp: (!) 35.8 C 36.7 C    Last Pain:  Vitals:   06/25/16 1535  TempSrc: Oral  PainSc:    Pain Goal: Patients Stated Pain Goal: 3 (06/25/16 1340)               Jane Hill

## 2016-06-25 NOTE — Discharge Instructions (Signed)

## 2016-06-25 NOTE — OR Nursing (Signed)
FAMILY CALLED @ 0815 AND AGAIN @ [phone] WITH AN UPDATE.

## 2016-06-25 NOTE — Anesthesia Postprocedure Evaluation (Signed)
Anesthesia Post Note  Patient: Jane Hill  Procedure(s) Performed: Procedure(s) (LRB): HYSTERECTOMY TOTAL LAPAROSCOPIC (N/A) LAPAROSCOPIC BILATERAL SALPINGECTOMY Poss BSO (Bilateral) CYSTOSCOPY (N/A)  Patient location during evaluation: PACU Anesthesia Type: General Level of consciousness: sedated Pain management: satisfactory to patient Vital Signs Assessment: post-procedure vital signs reviewed and stable Respiratory status: spontaneous breathing Cardiovascular status: stable Anesthetic complications: no       Last Vitals:  Vitals:   06/25/16 1215 06/25/16 1230  BP: 129/80 116/79  Pulse: 61 (!) 59  Resp: 10 10  Temp: 36.5 C     Last Pain:  Vitals:   06/25/16 1215  TempSrc:   PainSc: 1                  Gwendy Boeder EDWARD

## 2016-06-25 NOTE — Progress Notes (Signed)
Pt discharged home in stable condition.  Accompanied off unit in wheelchair by [place], NT, to awaiting family. Discharge instructions & prescriptions reviewed with pt understanding verbalized.

## 2016-06-25 NOTE — Progress Notes (Signed)
MD at bedside discussing POC, discharge.

## 2016-06-25 NOTE — H&P (Signed)
Jane Hill is an 46 y.o. female G0 DWF here for definitive management of menorrhagia, anemia.  She does not tolerate estrogen products and has failed a mirena IUD.  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  Pt has been counseled about alterantive therapies including POPs and endometrial ablation.  She has decided to proceed with definitive treatment with hysterectomy.  Risks and benefits have been discussed.  She is here with her mother and is ready to proceed.   Pertinent Gynecological History: Menses: regular, heavy, lasts at least a full 7 days Contraception: abstinence DES exposure: denies Blood transfusions: none Sexually transmitted diseases: no past history Previous GYN Procedures: endometrial biopsy, negative for pathology  Last mammogram: normal Date: 2/18 Last pap: normal Date: 1/18 OB History: G0, P0   Menstrual History: Patient's last menstrual period was 06/12/2016 (exact date).    Past Medical History:  Diagnosis Date  . Carpal tunnel syndrome 2004   R  . Community acquired MRSA infection 2008  . Dyspnea    with exertion  . Fibrocystic breast   . Hypothyroidism   . Kidney stones 12/2015  . Left shoulder pain     12/2015  . Migraine     Past Surgical History:  Procedure Laterality Date  . DIAGNOSTIC LAPAROSCOPY  1995   LOA, endometriosis    Family History  Problem Relation Age of Onset  . Breast cancer Mother 34    02/2013  . Cancer Paternal Grandmother 35    breast  . Breast cancer Paternal Grandmother   . Heart disease Maternal Grandmother   . Heart disease Maternal Grandfather     Social History:  reports that she has never smoked. She has never used smokeless tobacco. She reports that she drinks about 0.6 - 1.2 oz of alcohol per week . She reports that she does not use drugs.  Allergies:  Allergies  Allergen Reactions  . Estring [Estradiol] Rash    Prescriptions Prior to Admission  Medication Sig Dispense  Refill Last Dose  . albuterol (PROVENTIL) (2.5 MG/3ML) 0.083% nebulizer solution Take 2.5 mg by nebulization every 6 (six) hours as needed for wheezing or shortness of breath.   Past Month at Unknown time  . ferrous sulfate 325 (65 FE) MG tablet Take 325 mg by mouth daily with breakfast.   06/24/2016 at 0600  . fexofenadine (ALLEGRA) 180 MG tablet Take 180 mg by mouth daily as needed (for seasonal allergies.).   Past Week at Unknown time  . folic acid (FOLVITE) 426 MCG tablet Take 400 mcg by mouth daily.   06/24/2016 at 0600  . Homeopathic Products (ARNICARE) GEL Apply topically.   Past Week at Unknown time  . levothyroxine (SYNTHROID, LEVOTHROID) 50 MCG tablet Take 50 mcg by mouth daily before breakfast.   06/25/2016 at 0430  . Multiple Vitamin (MULTIVITAMIN WITH MINERALS) TABS tablet Take 1 tablet by mouth daily.   Past Week at Unknown time  . SUMAtriptan (IMITREX) 100 MG tablet Take 100 mg by mouth every 2 (two) hours as needed for migraine. May repeat in 2 hours if headache persists or recurs.   Past Month  . topiramate (TOPAMAX) 100 MG tablet Take 150 mg by mouth at bedtime.   06/24/2016 at 2100  . vitamin B-12 (CYANOCOBALAMIN) 250 MCG tablet Take 250 mcg by mouth daily.   06/24/2016 at 0600    Review of Systems  All other systems reviewed and are negative.   Blood pressure (!) 139/98, pulse 75,  temperature 98.1 F (36.7 C), temperature source Oral, resp. rate 20, height 5' 5.5" (1.664 m), weight 230 lb (104.3 kg), last menstrual period 06/12/2016, SpO2 100 %. Physical Exam  Constitutional: She is oriented to person, place, and time. She appears well-developed and well-nourished.  Cardiovascular: Normal rate and regular rhythm.   Respiratory: Effort normal and breath sounds normal.  Neurological: She is alert and oriented to person, place, and time.  Skin: Skin is warm and dry.  Psychiatric: She has a normal mood and affect.    Results for orders placed or performed during the hospital  encounter of 06/25/16 (from the past 24 hour(s))  Pregnancy, urine     Status: None   Collection Time: 06/25/16  6:00 AM  Result Value Ref Range   Preg Test, Ur NEGATIVE NEGATIVE    No results found.  Assessment/Plan: 46 yo G0 DWF here for definitive treatment of menorrhagia, inability to tolerate estrogen products, and has failed a Mirena IUD.  Questions answered.  She is here and ready to proceed.  Hale Bogus SUZANNE 06/25/2016, 7:03 AM

## 2016-06-25 NOTE — Transfer of Care (Signed)
Immediate Anesthesia Transfer of Care Note  Patient: Jane Hill  Procedure(s) Performed: Procedure(s): HYSTERECTOMY TOTAL LAPAROSCOPIC (N/A) LAPAROSCOPIC BILATERAL SALPINGECTOMY Poss BSO (Bilateral) CYSTOSCOPY (N/A)  Patient Location: PACU  Anesthesia Type:MAC  Level of Consciousness: awake, alert  and oriented  Airway & Oxygen Therapy: Patient Spontanous Breathing and Patient connected to nasal cannula oxygen  Post-op Assessment: Report given to RN and Post -op Vital signs reviewed and stable  Post vital signs: Reviewed and stable  Last Vitals:  Vitals:   06/25/16 0621  BP: (!) 139/98  Pulse: 75  Resp: 20  Temp: 36.7 C    Last Pain:  Vitals:   06/25/16 0621  TempSrc: Oral  PainSc: 0-No pain      Patients Stated Pain Goal: 3 (95/63/87 5643)  Complications: No apparent anesthesia complications

## 2016-06-25 NOTE — Addendum Note (Signed)
Addendum  created 06/25/16 1438 by Genevie Ann, CRNA   Charge Capture section accepted

## 2016-06-25 NOTE — Anesthesia Procedure Notes (Signed)
Date/Time: 06/25/2016 7:35 AM Performed by: Early Ord, Sheron Nightingale Pre-anesthesia Checklist: Patient identified, Emergency Drugs available, Suction available, Patient being monitored and Timeout performed Patient Re-evaluated:Patient Re-evaluated prior to inductionOxygen Delivery Method: Circle system utilized Preoxygenation: Pre-oxygenation with 100% oxygen Intubation Type: IV induction Ventilation: Mask ventilation without difficulty Laryngoscope Size: Mac and Glidescope Grade View: Grade II Tube type: Oral Tube size: 7.0 mm Number of attempts: 2 Placement Confirmation: ETT inserted through vocal cords under direct vision,  positive ETCO2 and breath sounds checked- equal and bilateral Secured at: 21 cm Dental Injury: Teeth and Oropharynx as per pre-operative assessment

## 2016-06-26 ENCOUNTER — Encounter (HOSPITAL_COMMUNITY): Payer: Self-pay | Admitting: Obstetrics & Gynecology

## 2016-06-29 ENCOUNTER — Telehealth: Payer: Self-pay | Admitting: Obstetrics & Gynecology

## 2016-06-29 NOTE — Telephone Encounter (Signed)
Patient called to give the nurse an update on how she is feeling since her surgery earlier this week. She has an appointment for a 1 week recheck on Monday, 07/02/16, but says Dr. Sabra Heck told her to call our office today with an update on her status.

## 2016-06-29 NOTE — Telephone Encounter (Signed)
Spoke with patient. Patient states she is calling with update for Dr. Sabra Heck, had [place] on 3/12. Patient reports she had BM 3/15 and is voiding well. No vaginal bleeding. Patient states throat is still sore, some difficulty swallowing, but better. Patient reports she is able to eat now. Patient reports using 800 mg Motrin q 8hrs for pain, no percocet. Advised patient would update Dr. Sabra Heck and return call with any additional recommendations. Keep  f/u appointment with Dr. Sabra Heck on 3/19. Patient verbalizes understanding and is agreeable.  Routing to provider for final review. Patient is agreeable to disposition. Will close encounter.

## 2016-06-29 NOTE — Discharge Summary (Signed)
Physician Discharge Summary  Patient ID: Jane Hill MRN: [id number] DOB/AGE: 10/29/1970 46 y.o.  Admit date: 06/25/2016 Discharge date: 06/29/2016  Admission Diagnoses: menorrhagia, anemia, declines blood product use, obesity  Discharge Diagnoses:  Active Problems:   Menorrhagia  Discharged Condition: good  Hospital Course: Patient admitted through same day surgery.  She was taken to OR where TLH/bilateral salpingectomy/cystoscopy were performed.  Surgical findings included normal pelvic and normal upper abdomen.  Surgery was uneventful.  EBL 50cc.  Foley catheter was removed before leaving OR.  Patient transferred to PACU where she was stable and then to 3rd floor for the remainder of her hospitalization.  During her post-op recovery, her vitals and stable and she was AF.  In evening of POD#0, she was able to transition to oral pain medications and regular diet.  She was able to ambulate and she had good pain control.  She was also able to void on her own multiple times.  She had passed flatus and walked in the halls.  Post op hb was 10.8 decreased from 11.2.  At this point she was meeting all criteria for discharge and requested to go home.  Mother was with her.  All post op instructions were reviewed personally.  They both felt comfortable with this decision.  Consults: None  Significant Diagnostic Studies: labs: post op hb 10.8  Treatments: surgery: TLH/Bilateral salpingectomy/cystoscopy  Discharge Exam: Blood pressure 117/68, pulse 60, temperature 98.1 F (36.7 C), temperature source Oral, resp. rate 20, height 5' 5.5" (1.664 m), weight 230 lb (104.3 kg), last menstrual period 06/12/2016, SpO2 98 %. General appearance: alert and cooperative Resp: clear to auscultation bilaterally Cardio: regular rate and rhythm, S1, S2 normal, no murmur, click, rub or gallop GI: soft, non-tender; bowel sounds normal; no masses,  no organomegaly Incision/Wound: C/D/I  Vaginal bleeding:   none  Disposition: 01-Home or Self Care   Allergies as of 06/25/2016      Reactions   Estring [estradiol] Rash      Medication List    TAKE these medications   albuterol (2.5 MG/3ML) 0.083% nebulizer solution Commonly known as:  PROVENTIL Take 2.5 mg by nebulization every 6 (six) hours as needed for wheezing or shortness of breath.   ARNICARE Gel Apply topically.   ferrous sulfate 325 (65 FE) MG tablet Take 325 mg by mouth daily with breakfast.   fexofenadine 180 MG tablet Commonly known as:  ALLEGRA Take 180 mg by mouth daily as needed (for seasonal allergies.).   folic acid 034 MCG tablet Commonly known as:  FOLVITE Take 400 mcg by mouth daily.   ibuprofen 800 MG tablet Commonly known as:  ADVIL,MOTRIN Take 1 tablet (800 mg total) by mouth every 8 (eight) hours as needed.   levothyroxine 50 MCG tablet Commonly known as:  SYNTHROID, LEVOTHROID Take 50 mcg by mouth daily before breakfast.   multivitamin with minerals Tabs tablet Take 1 tablet by mouth daily.   oxyCODONE-acetaminophen 5-325 MG tablet Commonly known as:  PERCOCET/ROXICET Take 1-2 tablets by mouth every 6 (six) hours as needed for severe pain (moderate to severe pain (when tolerating fluids)).   SUMAtriptan 100 MG tablet Commonly known as:  IMITREX Take 100 mg by mouth every 2 (two) hours as needed for migraine. May repeat in 2 hours if headache persists or recurs.   topiramate 100 MG tablet Commonly known as:  TOPAMAX Take 150 mg by mouth at bedtime.   vitamin B-12 250 MCG tablet Commonly known as:  CYANOCOBALAMIN Take 250  mcg by mouth daily.        SignedLyman Speller 06/29/2016, 10:52 AM

## 2016-07-02 ENCOUNTER — Ambulatory Visit (INDEPENDENT_AMBULATORY_CARE_PROVIDER_SITE_OTHER): Payer: 59 | Admitting: Obstetrics & Gynecology

## 2016-07-02 ENCOUNTER — Encounter: Payer: Self-pay | Admitting: Obstetrics & Gynecology

## 2016-07-02 ENCOUNTER — Ambulatory Visit: Payer: 59 | Admitting: Obstetrics & Gynecology

## 2016-07-02 VITALS — BP 114/68 | HR 88 | Temp 98.1°F | Resp 16 | Ht 65.5 in | Wt 228.0 lb

## 2016-07-02 DIAGNOSIS — Z9889 Other specified postprocedural states: Secondary | ICD-10-CM

## 2016-07-02 NOTE — Progress Notes (Signed)
Post Operative Visit  Procedure: HYSTERECTOMY TOTAL LAPAROSCOPIC (N/A Abdomen); LAPAROSCOPIC BILATERAL SALPINGECTOMY Poss BSO (Bilateral Abdomen); CYSTOSCOPY (N/A Urethra)   Days Post-op: 7 days  Subjective: Doing well.  Reports she did "too much" yesterday--she scrubbed her baseboards.  Feeling more stiff today.  Last percocet she took was on Friday.  Still taking the ibuprofen and is down to one a day.  Having regular bowel movements and emptying her bladder without difficulty.  Denies vaginal bleeding.  Denies fever.    Objective: BP 114/68 (BP Location: Right Arm, Patient Position: Sitting, Cuff Size: Large)   Pulse 88   Temp 98.1 F (36.7 C) (Oral)   Resp 16   Ht 5' 5.5" (1.664 m)   Wt 228 lb (103.4 kg)   LMP 06/12/2016 (Exact Date)   BMI 37.36 kg/m   EXAM General: alert, cooperative and no distress Resp: clear to auscultation bilaterally Cardio: regular rate and rhythm GI: soft, non-tender; bowel sounds normal; no masses,  no organomegaly and incision: clean, dry and intact Extremities: extremities normal, atraumatic, no cyanosis or edema Vaginal Bleeding: none  Assessment: s/p TLH/bilateral salpingectomy, cystoscopy  Plan: Recheck 3 weeks

## 2016-07-02 NOTE — Progress Notes (Deleted)
Post Operative Visit  Procedure: HYSTERECTOMY TOTAL LAPAROSCOPIC, LAPAROSCOPIC BILATERAL SALPINGECTOMY Poss BSO, CYSTOSCOPY     Days Post-op: 7 days   Subjective: ***  Objective: LMP 06/12/2016 (Exact Date)   EXAM General: {Exam; general:16600} Resp: {Exam; lung:16931} Cardio: {Exam; heart:5510} GI: {Exam, BZ:2080223} Extremities: {Exam; extremity:5109} Vaginal Bleeding: {exam; vaginal bleeding:3041122}  Assessment: s/p ***  Plan: Recheck {NUMBER 1-10:22536} weeks ***

## 2016-07-23 ENCOUNTER — Encounter: Payer: Self-pay | Admitting: Obstetrics & Gynecology

## 2016-07-23 ENCOUNTER — Ambulatory Visit (INDEPENDENT_AMBULATORY_CARE_PROVIDER_SITE_OTHER): Payer: 59 | Admitting: Obstetrics & Gynecology

## 2016-07-23 VITALS — BP 118/76 | HR 68 | Temp 98.2°F | Resp 16 | Wt 228.0 lb

## 2016-07-23 DIAGNOSIS — R350 Frequency of micturition: Secondary | ICD-10-CM

## 2016-07-23 DIAGNOSIS — R300 Dysuria: Secondary | ICD-10-CM | POA: Diagnosis not present

## 2016-07-23 LAB — POCT URINALYSIS DIPSTICK
Bilirubin, UA: NEGATIVE
Blood, UA: NEGATIVE
Glucose, UA: NEGATIVE
KETONES UA: NEGATIVE
Nitrite, UA: NEGATIVE
PH UA: 7 (ref 5.0–8.0)
PROTEIN UA: NEGATIVE
UROBILINOGEN UA: NEGATIVE (ref ?–2.0)

## 2016-07-23 MED ORDER — SULFAMETHOXAZOLE-TRIMETHOPRIM 800-160 MG PO TABS: 1.0000 | ORAL_TABLET | Freq: Two times a day (BID) | ORAL | 0 refills | Status: DC

## 2016-07-23 NOTE — Addendum Note (Signed)
Addended by: Zoila Shutter D on: 07/23/2016 04:46 PM   Modules accepted: Orders

## 2016-07-23 NOTE — Progress Notes (Signed)
GYNECOLOGY  VISIT   HPI: 46 y.o. G56P0000 Divorced Caucasian female here for follow up after hysterectomy performed 06/25/16.  She reports about a week and a half ago, she had some vaginal discharge that was followed by increase in pain with urinary, primarily at the end of urination.  She reports having a single episode of incontinence at night during this time frame as well.  Saw urologist last week for follow-up from kidney stones.  Urine micro showed trace blood and leukocyte esterase.  No culture was done.  Advised to follow up with me.  Denies vaginal bleeding.  Denies GI issues.  Denies fever.  Not taking anything for pain.  Hasn't taken anything in two weeks or more.  GYNECOLOGIC HISTORY: Patient's last menstrual period was 06/12/2016 (exact date). Contraception: hysterectomy  Patient Active Problem List   Diagnosis Date Noted  . Menorrhagia 06/25/2016  . Refusal of blood transfusions as patient is Jehovah's Witness 06/08/2016  . Hypothyroidism 06/08/2016  . Migraine without aura     Past Medical History:  Diagnosis Date  . Carpal tunnel syndrome 2004   R  . Community acquired MRSA infection 2008  . Dyspnea    with exertion  . Fibrocystic breast   . Hypothyroidism   . Kidney stones 12/2015  . Left shoulder pain     12/2015  . Migraine     Past Surgical History:  Procedure Laterality Date  . CYSTOSCOPY N/A 06/25/2016   Procedure: CYSTOSCOPY;  Surgeon: Megan Salon, MD;  Location: [facility];  Service: Gynecology;  Laterality: N/A;  . DIAGNOSTIC LAPAROSCOPY  1995   LOA, endometriosis  . LAPAROSCOPIC BILATERAL SALPINGECTOMY Bilateral 06/25/2016   Procedure: LAPAROSCOPIC BILATERAL SALPINGECTOMY Poss BSO;  Surgeon: Megan Salon, MD;  Location: [facility];  Service: Gynecology;  Laterality: Bilateral;  . LAPAROSCOPIC HYSTERECTOMY N/A 06/25/2016   Procedure: HYSTERECTOMY TOTAL LAPAROSCOPIC;  Surgeon: Megan Salon, MD;  Location: [facility];  Service: Gynecology;  Laterality: N/A;     MEDS:  Reviewed in EPIC and UTD  ALLERGIES: Estring [estradiol]  Family History  Problem Relation Age of Onset  . Breast cancer Mother 64    02/2013  . Cancer Paternal Grandmother 3    breast  . Breast cancer Paternal Grandmother   . Heart disease Maternal Grandmother   . Heart disease Maternal Grandfather     SH:  Single, non smoker  Review of Systems  All other systems reviewed and are negative.   PHYSICAL EXAMINATION:    BP 118/76 (BP Location: Right Arm, Patient Position: Sitting, Cuff Size: Large)   Pulse 68   Temp 98.2 F (36.8 C) (Oral)   Resp 16   Wt 228 lb (103.4 kg)   LMP 06/12/2016 (Exact Date)   BMI 37.36 kg/m     General appearance: alert, cooperative and appears stated age CV:  Regular rate and rhythm Lungs:  clear to auscultation, no wheezes, rales or rhonchi, symmetric air entry Abdomen: soft, +suprapubic tenderness; bowel sounds normal; no masses,  no organomegaly Inc:  C/D/I  Pelvic: External genitalia:  no lesions              Urethra:  normal appearing urethra with no masses, tenderness or lesions              Bartholins and Skenes: normal                 Vagina: normal appearing vagina with normal color and discharge, no lesions, cuff healing well  Cervix: absent              Bimanual Exam:  Uterus:  uterus absent              Adnexa: normal adnexa              Anus: no lesions  Chaperone was present for exam.  Assessment: Post op from TLH/bilateral salpingectomy/cystoscopy Dysuria Kidney stones  Plan: Urine culture pending Bactrim DS BID x 5 days.  Follow up will be planned after urine culture results are finalized.

## 2016-07-24 LAB — URINALYSIS, MICROSCOPIC ONLY
Bacteria, UA: NONE SEEN [HPF]
CASTS: NONE SEEN [LPF]
CRYSTALS: NONE SEEN [HPF]
RBC / HPF: NONE SEEN RBC/HPF (ref <=2)
YEAST: NONE SEEN [HPF]

## 2016-07-24 LAB — URINE CULTURE

## 2016-07-27 ENCOUNTER — Telehealth: Payer: Self-pay | Admitting: *Deleted

## 2016-07-27 NOTE — Telephone Encounter (Signed)
Pt notified. Verbalized understanding. Patient does not have any Sx. recommended to finish abx and does not need f/u unless she develops Sx. Per Dr. Sabra Heck   Advised patient we need a "Fit for Duty" form from her job to complete paper work so she can go back to work on 4/23. - patient states she will call her job Monday.

## 2016-07-27 NOTE — Telephone Encounter (Signed)
Left message to call back. Please route to triage if I am not available.

## 2016-07-27 NOTE — Telephone Encounter (Signed)
-----   Message from Megan Salon, MD sent at 07/26/2016 11:15 PM EDT ----- Please inform pt that her urine micro and culture were both negative.  Please see if symptoms have continued since beginning antibiotics.  If so, I would recommend a trial of myrbetriq 25mg  daily to see if this will eliminate symptoms.  She will need to recheck in 2 weeks before she is to return to work.  Rx has not been sent to pharmacy.

## 2016-10-26 NOTE — Anesthesia Postprocedure Evaluation (Signed)
Anesthesia Post Note  Patient: Jane Hill  Procedure(s) Performed: Procedure(s) (LRB): HYSTERECTOMY TOTAL LAPAROSCOPIC (N/A) LAPAROSCOPIC BILATERAL SALPINGECTOMY Poss BSO (Bilateral) CYSTOSCOPY (N/A)     Anesthesia Post Evaluation  Last Vitals:  Vitals:   06/25/16 1245 06/25/16 1535  BP: 118/70 117/68  Pulse: 66 60  Resp: 14 20  Temp: (!) 35.8 C 36.7 C    Last Pain:  Vitals:   06/25/16 1703  TempSrc:   PainSc: [place]

## 2016-10-26 NOTE — Addendum Note (Signed)
Addendum  created 10/26/16 1452 by Lyndle Herrlich, MD   Sign clinical note

## 2016-11-20 ENCOUNTER — Encounter: Payer: Self-pay | Admitting: Obstetrics & Gynecology

## 2016-11-20 ENCOUNTER — Telehealth: Payer: Self-pay | Admitting: Obstetrics & Gynecology

## 2016-11-20 ENCOUNTER — Ambulatory Visit (INDEPENDENT_AMBULATORY_CARE_PROVIDER_SITE_OTHER): Payer: 59 | Admitting: Obstetrics & Gynecology

## 2016-11-20 VITALS — BP 122/78 | HR 80 | Resp 14 | Ht 65.5 in | Wt 236.0 lb

## 2016-11-20 DIAGNOSIS — R309 Painful micturition, unspecified: Secondary | ICD-10-CM

## 2016-11-20 LAB — POCT URINALYSIS DIPSTICK
BILIRUBIN UA: NEGATIVE
Blood, UA: NEGATIVE
GLUCOSE UA: NEGATIVE
KETONES UA: NEGATIVE
LEUKOCYTES UA: NEGATIVE
Nitrite, UA: NEGATIVE
Protein, UA: NEGATIVE
Urobilinogen, UA: 0.2 E.U./dL
pH, UA: 5 (ref 5.0–8.0)

## 2016-11-20 MED ORDER — SULFAMETHOXAZOLE-TRIMETHOPRIM 800-160 MG PO TABS: 1.0000 | ORAL_TABLET | Freq: Two times a day (BID) | ORAL | 0 refills | Status: DC

## 2016-11-20 NOTE — Progress Notes (Signed)
GYNECOLOGY  VISIT   HPI: 46 y.o. G1P0000 Divorced Caucasian female here for increased urinary frequency and urgency that has worsened over the past 24 hours.  Denies hematuria.  Having increased pelvic pressure as well.  Denies fever and back pain.  Reports she had a little incontinence  Denies vaginal discharge or vaginal bleeding.  Feels "hot" in her vagina.  Not really sure if this is related.  Not SA.  GYNECOLOGIC HISTORY: Patient's last menstrual period was 06/12/2016 (exact date). Contraception: hysterectomy Menopausal hormone therapy: n/a  Patient Active Problem List   Diagnosis Date Noted  . Menorrhagia 06/25/2016  . Refusal of blood transfusions as patient is Jehovah's Witness 06/08/2016  . Hypothyroidism 06/08/2016  . Migraine without aura     Past Medical History:  Diagnosis Date  . Carpal tunnel syndrome 2004   R  . Community acquired MRSA infection 2008  . Dyspnea    with exertion  . Fibrocystic breast   . Hypothyroidism   . Kidney stones 12/2015  . Left shoulder pain     12/2015  . Migraine     Past Surgical History:  Procedure Laterality Date  . CYSTOSCOPY N/A 06/25/2016   Procedure: CYSTOSCOPY;  Surgeon: Megan Salon, MD;  Location: [facility];  Service: Gynecology;  Laterality: N/A;  . DIAGNOSTIC LAPAROSCOPY  1995   LOA, endometriosis  . LAPAROSCOPIC BILATERAL SALPINGECTOMY Bilateral 06/25/2016   Procedure: LAPAROSCOPIC BILATERAL SALPINGECTOMY Poss BSO;  Surgeon: Megan Salon, MD;  Location: [facility];  Service: Gynecology;  Laterality: Bilateral;  . LAPAROSCOPIC HYSTERECTOMY N/A 06/25/2016   Procedure: HYSTERECTOMY TOTAL LAPAROSCOPIC;  Surgeon: Megan Salon, MD;  Location: [facility];  Service: Gynecology;  Laterality: N/A;    MEDS:   Current Outpatient Prescriptions on File Prior to Visit  Medication Sig Dispense Refill  . albuterol (PROVENTIL) (2.5 MG/3ML) 0.083% nebulizer solution Take 2.5 mg by nebulization every 6 (six) hours as needed for wheezing or  shortness of breath.    . cetirizine (ZYRTEC) 10 MG tablet Take 10 mg by mouth daily.    . ferrous sulfate 325 (65 FE) MG tablet Take 325 mg by mouth daily with breakfast.    . folic acid (FOLVITE) 101 MCG tablet Take 400 mcg by mouth daily.    . Homeopathic Products (ARNICARE) GEL Apply topically.    Marland Kitchen ibuprofen (ADVIL,MOTRIN) 800 MG tablet Take 1 tablet (800 mg total) by mouth every 8 (eight) hours as needed. 30 tablet 0  . levothyroxine (SYNTHROID, LEVOTHROID) 50 MCG tablet Take 50 mcg by mouth daily before breakfast.    . MAGNESIUM PO Take by mouth.    . Multiple Vitamin (MULTIVITAMIN WITH MINERALS) TABS tablet Take 1 tablet by mouth daily.    . SUMAtriptan (IMITREX) 100 MG tablet Take 100 mg by mouth every 2 (two) hours as needed for migraine. May repeat in 2 hours if headache persists or recurs.    . vitamin B-12 (CYANOCOBALAMIN) 250 MCG tablet Take 250 mcg by mouth daily.     No current facility-administered medications on file prior to visit.      ALLERGIES: Estring [estradiol]  Family History  Problem Relation Age of Onset  . Breast cancer Mother 11       02/2013  . Cancer Paternal Grandmother 39       breast  . Breast cancer Paternal Grandmother   . Heart disease Maternal Grandmother   . Heart disease Maternal Grandfather     SH:  Divorced, non smoker  ROS  PHYSICAL EXAMINATION:    BP 122/78 (BP Location: Right Arm, Patient Position: Sitting, Cuff Size: Large)   Pulse 80   Resp 14   Ht 5' 5.5" (1.664 m)   Wt 236 lb (107 kg)   LMP 06/12/2016 (Exact Date)   BMI 38.68 kg/m     General appearance: alert, cooperative and appears stated age Abdomen: soft, non-tender; bowel sounds normal; no masses,  no organomegaly Flank:  No CVA tenderness  Pelvic: External genitalia:  no lesions              Urethra:  normal appearing urethra with no masses, tenderness or lesions              Bartholins and Skenes: normal                 Vagina: normal appearing vagina with  normal color and discharge, no lesions              Cervix: absent              Bimanual Exam:  Uterus:  uterus absent              Adnexa: no mass, fullness, tenderness              Anus:  no lesions  Chaperone was present for exam.  Assessment: Urinary urgency and increased urinary frequency H/O nephrolithiasis  Plan: Urine culture pending Septra DS bid x 5 days May be I.C.  If culture is negative will refer back to urology.

## 2016-11-20 NOTE — Telephone Encounter (Addendum)
Spoke with Fanny Skates at [facility], will transfer bactrim prescription to their store.

## 2016-11-20 NOTE — Telephone Encounter (Signed)
Patient was seen today and the medication was sent to the wrong pharmacy.  She states it is the [facility] on [address] in [place], [place].  She would like it sent there and the pharmacy updated in her chart.

## 2016-11-20 NOTE — Telephone Encounter (Signed)
Patient thinks she has a UTI and would like an appointment today if possible.

## 2016-11-20 NOTE — Telephone Encounter (Signed)
Call to patient. Advised prescription had been transferred to [facility] in [place] and her chart would be updated. Patient agreeable.   Patient agreeable to disposition. Will close encounter.

## 2016-11-20 NOTE — Telephone Encounter (Signed)
Spoke with patient. Patient states that she is having burning with urination, urinary frequency, low urine output. Denies any lower back pain, fever, chills, or blood in urine. Reports pain with urination is increasing. Advised will need to be seen for further evaluation. Appointment scheduled for 11/20/16 at 1:45 pm with Dr.Miller. Patient is agreeable to date and time.  Routing to provider for final review. Patient agreeable to disposition. Will close encounter.

## 2016-11-21 LAB — URINE CULTURE

## 2016-11-26 ENCOUNTER — Telehealth: Payer: Self-pay | Admitting: *Deleted

## 2016-11-26 DIAGNOSIS — N3010 Interstitial cystitis (chronic) without hematuria: Secondary | ICD-10-CM

## 2016-11-26 NOTE — Telephone Encounter (Signed)
Notes recorded by Burnice Logan, RN on 11/26/2016 at 11:13 AM EDT Left message to call Sharee Pimple at [phone]. See telephone encounter dated 11/26/16.  ------  Notes recorded by Megan Salon, MD on 11/25/2016 at 7:51 AM EDT Please let pt know her urine culture was negative. I'd like her to see urology as I think she may have interstitial cystitis which we talked about some when she was in the office. This is second episode of this without a positive urine culture. Had stones earlier this year so has urologist. Please enter possible interstitial cystitis as reason for referral.   Also, will you please see if her symptoms have resolved? Thanks.

## 2016-11-29 NOTE — Telephone Encounter (Signed)
Left message to call Antwone Capozzoli at [phone].  

## 2016-11-29 NOTE — Telephone Encounter (Signed)
Patient returning your call.

## 2016-11-30 NOTE — Telephone Encounter (Signed)
Spoke with patient and gave results and recommendations per Dr. Sabra Heck. Referral to urology sent for Dr. Elnoria Howard. Patient states her symptoms are currently improved.   CC:  Jane Hill

## 2017-01-09 ENCOUNTER — Telehealth: Payer: Self-pay | Admitting: Obstetrics & Gynecology

## 2017-01-09 NOTE — Telephone Encounter (Signed)
lmom requesting return call upon return call will encourage to call Dr. Peggye Pitt office.

## 2017-05-08 ENCOUNTER — Other Ambulatory Visit: Payer: Self-pay | Admitting: Obstetrics & Gynecology

## 2017-05-08 DIAGNOSIS — N6310 Unspecified lump in the right breast, unspecified quadrant: Secondary | ICD-10-CM

## 2017-06-11 ENCOUNTER — Ambulatory Visit
Admission: RE | Admit: 2017-06-11 | Discharge: 2017-06-11 | Disposition: A | Discharge status: Home / Self Care | Payer: 59 | Source: Ambulatory Visit | Attending: Obstetrics & Gynecology | Admitting: Obstetrics & Gynecology

## 2017-06-11 DIAGNOSIS — N6310 Unspecified lump in the right breast, unspecified quadrant: Secondary | ICD-10-CM

## 2017-07-08 ENCOUNTER — Other Ambulatory Visit: Payer: Self-pay

## 2017-07-08 ENCOUNTER — Encounter: Payer: Self-pay | Admitting: Obstetrics & Gynecology

## 2017-07-08 ENCOUNTER — Ambulatory Visit: Payer: 59 | Admitting: Obstetrics & Gynecology

## 2017-07-08 VITALS — BP 134/92 | HR 70 | Resp 16 | Ht 65.5 in | Wt 257.0 lb

## 2017-07-08 DIAGNOSIS — Z01419 Encounter for gynecological examination (general) (routine) without abnormal findings: Secondary | ICD-10-CM | POA: Diagnosis not present

## 2017-07-08 DIAGNOSIS — C19 Malignant neoplasm of rectosigmoid junction: Secondary | ICD-10-CM | POA: Diagnosis not present

## 2017-07-08 DIAGNOSIS — Z0000 Encounter for general adult medical examination without abnormal findings: Secondary | ICD-10-CM | POA: Diagnosis not present

## 2017-07-08 DIAGNOSIS — Z23 Encounter for immunization: Secondary | ICD-10-CM | POA: Diagnosis not present

## 2017-07-08 DIAGNOSIS — Z1211 Encounter for screening for malignant neoplasm of colon: Secondary | ICD-10-CM

## 2017-07-08 LAB — POCT URINALYSIS DIPSTICK
BILIRUBIN UA: NEGATIVE
Blood, UA: NEGATIVE
GLUCOSE UA: NEGATIVE
KETONES UA: NEGATIVE
LEUKOCYTES UA: NEGATIVE
Nitrite, UA: NEGATIVE
Protein, UA: NEGATIVE
Urobilinogen, UA: 0.2 E.U./dL
pH, UA: 5 (ref 5.0–8.0)

## 2017-07-08 MED ORDER — CLOBETASOL PROPIONATE 0.05 % EX OINT: 1.0000 "application " | TOPICAL_OINTMENT | Freq: Two times a day (BID) | CUTANEOUS | 0 refills | Status: DC

## 2017-07-08 MED ORDER — LIDOCAINE 5 % EX OINT: 1.0000 "application " | TOPICAL_OINTMENT | Freq: Three times a day (TID) | CUTANEOUS | 0 refills | Status: DC | PRN

## 2017-07-08 NOTE — Progress Notes (Signed)
47 y.o. G0P0000 DivorcedCaucasianF here for annual exam.  Having some allergy issues this year, more than normal.  This does contribute to headaches.  Is on an injectable headache preventative that has really helped.  Followed by Dr. Sima Matas.    Denies vaginal bleeding although she is having some increased issues with hemorrhoids.  Also reports she is having some vulvar skin irritation.  This has been going on for about six months.  Has used a topical steroid cream.  Does feel like this helped but is out of it.  Does have some itching with this as well.  [id number]  Patient's last menstrual period was 06/12/2016 (exact date).          Sexually active: No.  The current method of family planning is status post hysterectomy.    Exercising: No.   Smoker:  no  Health Maintenance: Pap:  04/30/16 Neg. HR HPV:neg   12/14/14 Neg  History of abnormal Pap:  Yes, 2015 ASCUS MMG:  06/12/17 [place] right BIRADS2:benign. F/u 1 year   Colonoscopy:  n/a BMD:   n/a TDaP:  2009 Screening Labs: PCP  ZY:[id number]    reports that she has never smoked. She has never used smokeless tobacco. She reports that she drinks about 0.6 - 1.2 oz of alcohol per week. She reports that she does not use drugs.  Past Medical History:  Diagnosis Date  . Carpal tunnel syndrome 2004   R  . Community acquired MRSA infection 2008  . Dyspnea    with exertion  . Fibrocystic breast   . Hypothyroidism   . Kidney stones 12/2015  . Left shoulder pain     12/2015  . Migraine     Past Surgical History:  Procedure Laterality Date  . CYSTOSCOPY N/A 06/25/2016   Procedure: CYSTOSCOPY;  Surgeon: Megan Salon, MD;  Location: [facility];  Service: Gynecology;  Laterality: N/A;  . DIAGNOSTIC LAPAROSCOPY  1995   LOA, endometriosis  . LAPAROSCOPIC BILATERAL SALPINGECTOMY Bilateral 06/25/2016   Procedure: LAPAROSCOPIC BILATERAL SALPINGECTOMY Poss BSO;  Surgeon: Megan Salon, MD;  Location: [facility];  Service: Gynecology;  Laterality: Bilateral;  .  LAPAROSCOPIC HYSTERECTOMY N/A 06/25/2016   Procedure: HYSTERECTOMY TOTAL LAPAROSCOPIC;  Surgeon: Megan Salon, MD;  Location: [facility];  Service: Gynecology;  Laterality: N/A;    Current Outpatient Medications  Medication Sig Dispense Refill  . albuterol (PROVENTIL) (2.5 MG/3ML) 0.083% nebulizer solution Take 2.5 mg by nebulization every 6 (six) hours as needed for wheezing or shortness of breath.    . cetirizine (ZYRTEC) 10 MG tablet Take 10 mg by mouth daily.    Eduard Roux 70 MG/ML SOAJ Inject into the skin every 30 (thirty) days.    . folic acid (FOLVITE) 601 MCG tablet Take 400 mcg by mouth daily.    Marland Kitchen levothyroxine (SYNTHROID, LEVOTHROID) 75 MCG tablet Take 1 tablet by mouth daily.    Marland Kitchen MAGNESIUM PO Take by mouth.    . Multiple Vitamin (MULTIVITAMIN WITH MINERALS) TABS tablet Take 1 tablet by mouth daily.    . ranitidine (ZANTAC) 75 MG tablet Take by mouth daily as needed.    . SUMAtriptan (IMITREX) 100 MG tablet Take 100 mg by mouth every 2 (two) hours as needed for migraine. May repeat in 2 hours if headache persists or recurs.     No current facility-administered medications for this visit.     Family History  Problem Relation Age of Onset  . Breast cancer Mother 54  02/2013  . Cancer Paternal Grandmother 42       breast  . Breast cancer Paternal Grandmother   . Heart disease Maternal Grandmother   . Heart disease Maternal Grandfather     Review of Systems  Constitutional:       Weight gain  HENT: Positive for congestion.   Gastrointestinal: Positive for blood in stool.  Genitourinary: Positive for frequency and urgency.  Neurological: Positive for headaches.  All other systems reviewed and are negative.   Exam:   BP (!) 134/92 (BP Location: Right Arm, Patient Position: Sitting, Cuff Size: Normal)   Pulse 70   Resp 16   Ht 5' 5.5" (1.664 m)   Wt 257 lb (116.6 kg)   LMP 06/12/2016 (Exact Date)   BMI 42.12 kg/m    Height: 5' 5.5" (166.4 cm)  Ht Readings  from Last 3 Encounters:  07/08/17 5' 5.5" (1.664 m)  11/20/16 5' 5.5" (1.664 m)  07/02/16 5' 5.5" (1.664 m)    General appearance: alert, cooperative and appears stated age Head: Normocephalic, without obvious abnormality, atraumatic Neck: no adenopathy, supple, symmetrical, trachea midline and thyroid normal to inspection and palpation Lungs: clear to auscultation bilaterally Breasts: normal appearance, no masses or tenderness Heart: regular rate and rhythm Abdomen: soft, non-tender; bowel sounds normal; no masses,  no organomegaly Extremities: extremities normal, atraumatic, no cyanosis or edema Skin: Skin color, texture, turgor normal. No rashes or lesions Lymph nodes: Cervical, supraclavicular, and axillary nodes normal. No abnormal inguinal nodes palpated Neurologic: Grossly normal   Pelvic: External genitalia:  Very mild hypopigmentation changes just on perineal body to the left              Urethra:  normal appearing urethra with no masses, tenderness or lesions              Bartholins and Skenes: normal                 Vagina: normal appearing vagina with normal color and discharge, no lesions              Cervix: surgically absent              Pap taken: No. Bimanual Exam:  Uterus:  uterus absent              Adnexa: no mass, fullness, tenderness               Rectovaginal: Confirms               Anus:  normal sphincter tone, small non-thrombosed hemorrhoid at 11 o'clock  Chaperone was present for exam.  A:  Well Woman with normal exam Migraines without aura, under much better control S/p TLH/bilateral salpingectomy 06/25/16 Vulvar itching Renal stones Hemorrhoids  P:   Mammogram guidelines reviewed.   pap smear not indicated IFOB given today.  New ACS colorectal cancer screening guidelines reviewed No blood work obtained today Pt will use topical clobetasol ointment 0.05% bid for 10 days and stop.  If itching/burning of vulva continues, advised to call back to  vulvar biopsy for further evaluation Tdap updated today Topical 5% lidocaine ointment to be used prn TID for pain with hemorrhoids also sent to pharmacy on file return annually or prn

## 2017-08-09 ENCOUNTER — Ambulatory Visit: Payer: 59 | Admitting: Obstetrics & Gynecology

## 2017-08-29 LAB — FECAL OCCULT BLOOD, IMMUNOCHEMICAL: IFOBT: NEGATIVE

## 2017-08-29 NOTE — Addendum Note (Signed)
Addended by: Lowella Fairy on: 08/29/2017 02:45 PM   Modules accepted: Orders

## 2018-05-09 ENCOUNTER — Other Ambulatory Visit: Payer: Self-pay | Admitting: Obstetrics & Gynecology

## 2018-05-09 DIAGNOSIS — Z1231 Encounter for screening mammogram for malignant neoplasm of breast: Secondary | ICD-10-CM

## 2018-06-12 ENCOUNTER — Ambulatory Visit
Admission: RE | Admit: 2018-06-12 | Discharge: 2018-06-12 | Disposition: A | Discharge status: Home / Self Care | Payer: 59 | Source: Ambulatory Visit | Attending: Obstetrics & Gynecology | Admitting: Obstetrics & Gynecology

## 2018-06-12 DIAGNOSIS — Z1231 Encounter for screening mammogram for malignant neoplasm of breast: Secondary | ICD-10-CM

## 2018-09-10 ENCOUNTER — Other Ambulatory Visit: Payer: Self-pay

## 2018-09-10 ENCOUNTER — Other Ambulatory Visit: Payer: Self-pay | Admitting: Obstetrics & Gynecology

## 2018-09-10 ENCOUNTER — Telehealth: Payer: Self-pay | Admitting: Obstetrics & Gynecology

## 2018-09-10 MED ORDER — CLOBETASOL PROPIONATE 0.05 % EX OINT: 1.0000 "application " | TOPICAL_OINTMENT | Freq: Two times a day (BID) | CUTANEOUS | 0 refills | Status: DC

## 2018-09-10 NOTE — Telephone Encounter (Signed)
Order for rx signed.  Ok to close encounter.

## 2018-09-10 NOTE — Telephone Encounter (Signed)
Patient notified of refill.   Encounter closed.  

## 2018-09-10 NOTE — Telephone Encounter (Signed)
Spoke with patient. 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 Patient denies any COVID 19 symptoms. Patient does not provide direct patient care. She is in contact with people, is unsure if she has been exposed, she has been notified of positive exposures in their office, not staff. Patient is scheduled for AEX 5/29, ok to reschedule to later date. AEX rescheduled to 8/14 at 3:30pm with Dr. Sabra Heck. Patient is requesting refill of clobetasol ointment, has used this in the past for vulvar itching. Denies any new symptoms or concerns. Advised I will review with Dr. Sabra Heck and return call, patient agreeable.   Dr. Sabra Heck -please review and advise on Rx.

## 2018-09-10 NOTE — Telephone Encounter (Signed)
Left message to call Torez Beauregard, RN at [facility] [phone].   

## 2018-09-10 NOTE — Telephone Encounter (Signed)
Patient's covid prescreen positive. She works for the [employer]. States she is unsure if she has been in direct contact with anyone covid positive, but does wear PPE. No symptoms.

## 2018-09-12 ENCOUNTER — Ambulatory Visit: Payer: 59 | Admitting: Obstetrics & Gynecology

## 2018-11-17 ENCOUNTER — Ambulatory Visit (INDEPENDENT_AMBULATORY_CARE_PROVIDER_SITE_OTHER): Payer: 59 | Admitting: Obstetrics & Gynecology

## 2018-11-17 ENCOUNTER — Encounter: Payer: Self-pay | Admitting: Obstetrics & Gynecology

## 2018-11-17 ENCOUNTER — Other Ambulatory Visit: Payer: Self-pay

## 2018-11-17 VITALS — BP 122/88 | HR 84 | Temp 96.9°F | Ht 65.5 in | Wt 266.0 lb

## 2018-11-17 DIAGNOSIS — L292 Pruritus vulvae: Secondary | ICD-10-CM | POA: Diagnosis not present

## 2018-11-17 DIAGNOSIS — Z1211 Encounter for screening for malignant neoplasm of colon: Secondary | ICD-10-CM | POA: Diagnosis not present

## 2018-11-17 DIAGNOSIS — Z01419 Encounter for gynecological examination (general) (routine) without abnormal findings: Secondary | ICD-10-CM | POA: Diagnosis not present

## 2018-11-17 NOTE — Patient Instructions (Signed)
[facility]   [address] [place], [place] [id number]  [phone]

## 2018-11-17 NOTE — Progress Notes (Signed)
48 y.o. G0P0000 Divorced White or Caucasian female here for annual exam.  Doing well.  Mostly working from home.  She likes working from home.  Denies vaginal bleeding.  Headaches have been worse.  Seeing Dr. Joretta Bachelor.    On a personal standpoint, she is going to move back in with her parents.  Her father was diagnosed with Alzheimer's.  Her paternal grandfather had Lewy Body dementia.  Her father does not have this diagnosis.  Mother had breast cancer and had mastectomy and she's having issues with moving her right arm.  Her father is still working.  She has experienced chronic vulvar itching.  She was given clobetasol last year.  She was to return and let me know if it still continued for possible biopsy.  PCP:  Has appt in September.  She will do blood work then.    Patient's last menstrual period was 06/12/2016 (exact date).          Sexually active: No.  The current method of family planning is status post hysterectomy.    Exercising: No.   Smoker:  no  Health Maintenance: Pap:  04/30/16 Neg. HR HPV:neg   12/14/14 Neg  History of abnormal Pap:  Yes, 2015 ASCUS MMG:  06/12/18 BIRADS1:Neg Colonoscopy:  Never BMD:   Never TDaP:  2019 Screening Labs: PCP   reports that she has never smoked. She has never used smokeless tobacco. She reports current alcohol use of about 3.0 - 4.0 standard drinks of alcohol per week. She reports that she does not use drugs.  Past Medical History:  Diagnosis Date  . Carpal tunnel syndrome 2004   R  . Community acquired MRSA infection 2008  . Dyspnea    with exertion  . Fibrocystic breast   . Hypothyroidism   . Kidney stones 12/2015  . Left shoulder pain     12/2015  . Migraine     Past Surgical History:  Procedure Laterality Date  . CYSTOSCOPY N/A 06/25/2016   Procedure: CYSTOSCOPY;  Surgeon: Megan Salon, MD;  Location: [facility];  Service: Gynecology;  Laterality: N/A;  . DIAGNOSTIC LAPAROSCOPY  1995   LOA, endometriosis  . LAPAROSCOPIC  BILATERAL SALPINGECTOMY Bilateral 06/25/2016   Procedure: LAPAROSCOPIC BILATERAL SALPINGECTOMY Poss BSO;  Surgeon: Megan Salon, MD;  Location: [facility];  Service: Gynecology;  Laterality: Bilateral;  . LAPAROSCOPIC HYSTERECTOMY N/A 06/25/2016   Procedure: HYSTERECTOMY TOTAL LAPAROSCOPIC;  Surgeon: Megan Salon, MD;  Location: [facility];  Service: Gynecology;  Laterality: N/A;    Current Outpatient Medications  Medication Sig Dispense Refill  . AIMOVIG 140 MG/ML SOAJ every 30 (thirty) days.    Marland Kitchen b complex vitamins tablet Take by mouth.    . folic acid (FOLVITE) 829 MCG tablet Take 400 mcg by mouth daily.    Marland Kitchen levothyroxine (SYNTHROID) 88 MCG tablet Take 1 tablet by mouth daily.    Marland Kitchen lidocaine (XYLOCAINE) 5 % ointment Apply 1 application topically 3 (three) times daily as needed. 30 g 0  . SUMAtriptan (IMITREX) 100 MG tablet Take 100 mg by mouth every 2 (two) hours as needed for migraine. May repeat in 2 hours if headache persists or recurs.    Marland Kitchen albuterol (PROVENTIL) (2.5 MG/3ML) 0.083% nebulizer solution Take 2.5 mg by nebulization every 6 (six) hours as needed for wheezing or shortness of breath.     No current facility-administered medications for this visit.     Family History  Problem Relation Age of Onset  . Breast  cancer Mother 54       02/2013  . Cancer Paternal Grandmother 52       breast  . Breast cancer Paternal Grandmother 80  . Heart disease Maternal Grandmother   . Heart disease Maternal Grandfather    Review of Systems  All other systems reviewed and are negative.  Exam:   BP 122/88   Pulse 84   Temp (!) 96.9 F (36.1 C) (Temporal)   Ht 5' 5.5" (1.664 m)   Wt 266 lb (120.7 kg)   LMP 06/12/2016 (Exact Date)   BMI 43.59 kg/m   Height: 5' 5.5" (166.4 cm)  Ht Readings from Last 3 Encounters:  11/17/18 5' 5.5" (1.664 m)  07/08/17 5' 5.5" (1.664 m)  11/20/16 5' 5.5" (1.664 m)    General appearance: alert, cooperative and appears stated age Head: Normocephalic,  without obvious abnormality, atraumatic Neck: no adenopathy, supple, symmetrical, trachea midline and thyroid normal to inspection and palpation Lungs: clear to auscultation bilaterally Breasts: normal appearance, no masses or tenderness Heart: regular rate and rhythm Abdomen: soft, non-tender; bowel sounds normal; no masses,  no organomegaly Extremities: extremities normal, atraumatic, no cyanosis or edema Skin: Skin color, texture, turgor normal. No rashes or lesions Lymph nodes: Cervical, supraclavicular, and axillary nodes normal. No abnormal inguinal nodes palpated Neurologic: Grossly normal   Pelvic: External genitalia:  no lesions but area of thickened tissue noted in area where pt is having itching on perineal body              Urethra:  normal appearing urethra with no masses, tenderness or lesions              Bartholins and Skenes: normal                 Vagina: normal appearing vagina with normal color and discharge, no lesions              Cervix: absent              Pap taken: No. Bimanual Exam:  Uterus:  uterus absent              Adnexa: no mass, fullness, tenderness               Rectovaginal: Confirms               Anus:  normal sphincter tone, no lesions  Procedure:  Vulvar biopsy recommended as not done last year.  Verbal consent obtained.  Area cleansed with Betadine.  Sterile technique used throughout procedure.  Skin anesthestized with Lidocaine 1% plain; 1.65mL.  Lot:  [id number].  Exp:  10/15/19.  3 punch biopsy used to obtain specimen.  Biopsy grasped with pick-ups and excised with scissors.  Adequate hemostasis obtained with silver nitrate sticks.  Dressing was not applied.  Pt tolerated procedure well.  Chaperone was present for exam.  A:  Well Woman with normal exam Migraines without aura S/p TLH/bilateral salpingectomy 06/25/16 H/o renal stones Hemorrhoids Hypothyroidism  Vulvar itching  P:   Mammogram guidelines reviewed pap smear not indicated IFOB  given to pt today Has appt with Dr. Sima Matas this week Vulvar biopsy obtained today. return annually or prn

## 2018-11-18 ENCOUNTER — Other Ambulatory Visit: Payer: Self-pay | Admitting: Obstetrics & Gynecology

## 2018-11-18 ENCOUNTER — Encounter: Payer: Self-pay | Admitting: Obstetrics & Gynecology

## 2018-11-18 MED ORDER — CLOBETASOL PROPIONATE 0.05 % EX OINT: 1.0000 "application " | TOPICAL_OINTMENT | Freq: Two times a day (BID) | CUTANEOUS | 1 refills | Status: DC

## 2018-11-27 LAB — FECAL OCCULT BLOOD, IMMUNOCHEMICAL: Fecal Occult Bld: NEGATIVE

## 2018-11-28 ENCOUNTER — Ambulatory Visit: Payer: Self-pay | Admitting: Obstetrics & Gynecology

## 2019-05-05 ENCOUNTER — Other Ambulatory Visit: Payer: Self-pay | Admitting: Obstetrics & Gynecology

## 2019-05-05 DIAGNOSIS — Z1231 Encounter for screening mammogram for malignant neoplasm of breast: Secondary | ICD-10-CM

## 2019-06-15 ENCOUNTER — Ambulatory Visit: Payer: 59

## 2019-06-15 ENCOUNTER — Other Ambulatory Visit: Payer: Self-pay

## 2019-07-13 ENCOUNTER — Other Ambulatory Visit: Payer: Self-pay

## 2019-07-13 ENCOUNTER — Ambulatory Visit
Admission: RE | Admit: 2019-07-13 | Discharge: 2019-07-13 | Disposition: A | Discharge status: Home / Self Care | Payer: 59 | Source: Ambulatory Visit | Attending: Obstetrics & Gynecology | Admitting: Obstetrics & Gynecology

## 2019-07-13 DIAGNOSIS — Z1231 Encounter for screening mammogram for malignant neoplasm of breast: Secondary | ICD-10-CM

## 2019-12-15 ENCOUNTER — Other Ambulatory Visit: Payer: Self-pay | Admitting: Obstetrics & Gynecology

## 2019-12-15 DIAGNOSIS — L292 Pruritus vulvae: Secondary | ICD-10-CM

## 2019-12-16 NOTE — Telephone Encounter (Signed)
Medication refill request: Clobetasol 0.05%  Last AEX:  12/14/18 Next AEX: 01/22/20 Last MMG (if hormonal medication request): NA Refill authorized: 60g/1

## 2019-12-29 ENCOUNTER — Telehealth: Payer: Self-pay | Admitting: Obstetrics & Gynecology

## 2019-12-29 NOTE — Telephone Encounter (Signed)
Left message on voicemail to call and reschedule cancelled appointment. °

## 2020-01-05 ENCOUNTER — Ambulatory Visit: Payer: 59 | Admitting: Obstetrics & Gynecology

## 2020-01-14 NOTE — Progress Notes (Signed)
49 y.o. G0P0000 Divorced White or Caucasian female here for annual exam.  Very sad today.  73 yo niece with CP passed on Aug 07, 2022.  She was minimally verbal but pt has strong bond/releationship with her.  She purchased her parents' home this past November.  Mother has fibromyalgia and father has Alzheimer's.  House is split in 2, basically.  She lives with them.  This has been stressful for her.  Denies vaginal bleeding.    Patient's last menstrual period was 06/12/2016 (exact date).          Sexually active: No.  The current method of family planning is status post hysterectomy.    Exercising: No.  exercise Smoker:  no  Health Maintenance: Pap:  04-30-16 neg HPV HR neg History of abnormal Pap:  yes  MMG:  07-14-2019 category b density birads 1:neg Colonoscopy:  none BMD:   none TDaP:  2019 Pneumonia vaccine(s):  no Shingrix:   no Hep C testing: not done Screening Labs: will do today   reports that she has never smoked. She has never used smokeless tobacco. She reports current alcohol use of about 1.0 standard drink of alcohol per week. She reports that she does not use drugs.  Past Medical History:  Diagnosis Date  . Carpal tunnel syndrome 2004   R  . Community acquired MRSA infection 2008  . Dyspnea    with exertion  . Fibrocystic breast   . Hypothyroidism   . Kidney stones 12/2015  . Left shoulder pain     12/2015  . Migraine     Past Surgical History:  Procedure Laterality Date  . CYSTOSCOPY N/A 06/25/2016   Procedure: CYSTOSCOPY;  Surgeon: Megan Salon, MD;  Location: [facility];  Service: Gynecology;  Laterality: N/A;  . DIAGNOSTIC LAPAROSCOPY  1995   LOA, endometriosis  . LAPAROSCOPIC BILATERAL SALPINGECTOMY Bilateral 06/25/2016   Procedure: LAPAROSCOPIC BILATERAL SALPINGECTOMY Poss BSO;  Surgeon: Megan Salon, MD;  Location: [facility];  Service: Gynecology;  Laterality: Bilateral;  . LAPAROSCOPIC HYSTERECTOMY N/A 06/25/2016   Procedure: HYSTERECTOMY TOTAL LAPAROSCOPIC;   Surgeon: Megan Salon, MD;  Location: [facility];  Service: Gynecology;  Laterality: N/A;    Current Outpatient Medications  Medication Sig Dispense Refill  . b complex vitamins tablet Take by mouth.    . clobetasol ointment (TEMOVATE) 0.05 % Apply topically 2 (two) times daily. Can use for up to 5 days. 60 g 1  . levothyroxine (SYNTHROID) 88 MCG tablet Take 1 tablet by mouth daily.    Marland Kitchen albuterol (PROVENTIL) (2.5 MG/3ML) 0.083% nebulizer solution Take 2.5 mg by nebulization every 6 (six) hours as needed for wheezing or shortness of breath. (Patient not taking: Reported on 01/15/2020)    . mometasone (ELOCON) 0.1 % ointment Apply topically twice weekly. 45 g 3  . SUMAtriptan (IMITREX) 100 MG tablet Take 100 mg by mouth every 2 (two) hours as needed for migraine. May repeat in 2 hours if headache persists or recurs. (Patient not taking: Reported on 01/15/2020)     No current facility-administered medications for this visit.    Family History  Problem Relation Age of Onset  . Breast cancer Mother 59       02/2013  . Cancer Paternal Grandmother 54       breast  . Breast cancer Paternal Grandmother 64  . Heart disease Maternal Grandmother   . Heart disease Maternal Grandfather     Review of Systems  Constitutional: Negative.   HENT: Negative.  Eyes: Negative.   Respiratory: Negative.   Cardiovascular: Negative.   Gastrointestinal: Negative.   Endocrine: Negative.   Genitourinary: Negative.   Musculoskeletal: Negative.   Skin: Negative.   Allergic/Immunologic: Negative.   Neurological: Negative.   Hematological: Negative.   Psychiatric/Behavioral: Negative.     Exam:   BP 104/64   Pulse 68   Resp 16   Ht 5' 5.25" (1.657 m)   Wt 255 lb (115.7 kg)   LMP 06/12/2016 (Exact Date)   BMI 42.11 kg/m   Height: 5' 5.25" (165.7 cm)  General appearance: alert, cooperative and appears stated age Head: Normocephalic, without obvious abnormality, atraumatic Neck: no adenopathy,  supple, symmetrical, trachea midline and thyroid normal to inspection and palpation Lungs: clear to auscultation bilaterally Breasts: normal appearance, no masses or tenderness Heart: regular rate and rhythm Abdomen: soft, non-tender; bowel sounds normal; no masses,  no organomegaly Extremities: extremities normal, atraumatic, no cyanosis or edema Skin: Skin color, texture, turgor normal. No rashes or lesions Lymph nodes: Cervical, supraclavicular, and axillary nodes normal. No abnormal inguinal nodes palpated Neurologic: Grossly normal  Pelvic: External genitalia:  Minimal skin changes at perineal body              Urethra:  normal appearing urethra with no masses, tenderness or lesions              Bartholins and Skenes: normal                 Vagina: normal appearing vagina with normal color and discharge, no lesions              Cervix: absent              Pap taken: No. Bimanual Exam:  Uterus:  uterus absent              Adnexa: no mass, fullness, tenderness               Rectovaginal: Confirms               Anus:  normal sphincter tone, no lesions  Chaperone, Royal Hawthorn, CMA, was present for exam.  A:  Well Woman with normal exam S/p TLH/bilateral salpingectomy 06/25/16 H/o renal stones Hypothyroidism Biopsy proven lichen sclerosus Grief  P:   Mammogram guidelines reviewed.   pap smear not indicated Colon cancer screening guidelines reviewed.   Will have blood work done in January with PCP Vaccines reviewed.  Pfizer booster discussed. Asked pt to call if feels needs any resources/referrals with loss of niece Rx for mometasone ointment 0.1% topically twice weekly.  #45 grams/3RDs.  Will use as maintenance. Rx for clobetasol 0.05% ointment BID up to 5 days for flares. return annually or prn

## 2020-01-15 ENCOUNTER — Ambulatory Visit (INDEPENDENT_AMBULATORY_CARE_PROVIDER_SITE_OTHER): Payer: 59 | Admitting: Obstetrics & Gynecology

## 2020-01-15 ENCOUNTER — Encounter: Payer: Self-pay | Admitting: Obstetrics & Gynecology

## 2020-01-15 ENCOUNTER — Other Ambulatory Visit: Payer: Self-pay

## 2020-01-15 VITALS — BP 104/64 | HR 68 | Resp 16 | Ht 65.25 in | Wt 255.0 lb

## 2020-01-15 DIAGNOSIS — L292 Pruritus vulvae: Secondary | ICD-10-CM

## 2020-01-15 DIAGNOSIS — Z01419 Encounter for gynecological examination (general) (routine) without abnormal findings: Secondary | ICD-10-CM

## 2020-01-15 MED ORDER — CLOBETASOL PROPIONATE 0.05 % EX OINT: TOPICAL_OINTMENT | Freq: Two times a day (BID) | CUTANEOUS | 1 refills | Status: DC

## 2020-01-15 MED ORDER — MOMETASONE FUROATE 0.1 % EX OINT: TOPICAL_OINTMENT | CUTANEOUS | 3 refills | Status: DC

## 2020-01-22 ENCOUNTER — Ambulatory Visit: Payer: 59 | Admitting: Obstetrics & Gynecology

## 2020-03-01 ENCOUNTER — Encounter: Payer: Self-pay | Admitting: Podiatry

## 2020-03-01 ENCOUNTER — Ambulatory Visit: Payer: 59 | Admitting: Podiatry

## 2020-03-01 ENCOUNTER — Ambulatory Visit (INDEPENDENT_AMBULATORY_CARE_PROVIDER_SITE_OTHER): Payer: 59

## 2020-03-01 ENCOUNTER — Other Ambulatory Visit: Payer: Self-pay

## 2020-03-01 DIAGNOSIS — M79671 Pain in right foot: Secondary | ICD-10-CM | POA: Diagnosis not present

## 2020-03-01 DIAGNOSIS — M7750 Other enthesopathy of unspecified foot: Secondary | ICD-10-CM | POA: Diagnosis not present

## 2020-03-01 DIAGNOSIS — M7752 Other enthesopathy of left foot: Secondary | ICD-10-CM | POA: Diagnosis not present

## 2020-03-01 DIAGNOSIS — M722 Plantar fascial fibromatosis: Secondary | ICD-10-CM | POA: Diagnosis not present

## 2020-03-01 DIAGNOSIS — M7751 Other enthesopathy of right foot: Secondary | ICD-10-CM

## 2020-03-01 MED ORDER — MELOXICAM 15 MG PO TABS: 15.0000 mg | ORAL_TABLET | Freq: Every day | ORAL | 0 refills | Status: AC

## 2020-03-01 NOTE — Patient Instructions (Addendum)
For instructions on how to put on your Plantar Fascial Brace, please visit [email or web address]   Plantar Fasciitis (Heel Spur Syndrome) with Rehab The plantar fascia is a fibrous, ligament-like, soft-tissue structure that spans the bottom of the foot. Plantar fasciitis is a condition that causes pain in the foot due to inflammation of the tissue. SYMPTOMS   Pain and tenderness on the underneath side of the foot.  Pain that worsens with standing or walking. CAUSES  Plantar fasciitis is caused by irritation and injury to the plantar fascia on the underneath side of the foot. Common mechanisms of injury include:  Direct trauma to bottom of the foot.  Damage to a small nerve that runs under the foot where the main fascia attaches to the heel bone.  Stress placed on the plantar fascia due to bone spurs. RISK INCREASES WITH:   Activities that place stress on the plantar fascia (running, jumping, pivoting, or cutting).  Poor strength and flexibility.  Improperly fitted shoes.  Tight calf muscles.  Flat feet.  Failure to warm-up properly before activity.  Obesity. PREVENTION  Warm up and stretch properly before activity.  Allow for adequate recovery between workouts.  Maintain physical fitness:  Strength, flexibility, and endurance.  Cardiovascular fitness.  Maintain a health body weight.  Avoid stress on the plantar fascia.  Wear properly fitted shoes, including arch supports for individuals who have flat feet.  PROGNOSIS  If treated properly, then the symptoms of plantar fasciitis usually resolve without surgery. However, occasionally surgery is necessary.  RELATED COMPLICATIONS   Recurrent symptoms that may result in a chronic condition.  Problems of the lower back that are caused by compensating for the injury, such as limping.  Pain or weakness of the foot during push-off following surgery.  Chronic inflammation, scarring, and partial or complete  fascia tear, occurring more often from repeated injections.  TREATMENT  Treatment initially involves the use of ice and medication to help reduce pain and inflammation. The use of strengthening and stretching exercises may help reduce pain with activity, especially stretches of the Achilles tendon. These exercises may be performed at home or with a therapist. Your caregiver may recommend that you use heel cups of arch supports to help reduce stress on the plantar fascia. Occasionally, corticosteroid injections are given to reduce inflammation. If symptoms persist for greater than 6 months despite non-surgical (conservative), then surgery may be recommended.   MEDICATION   If pain medication is necessary, then nonsteroidal anti-inflammatory medications, such as aspirin and ibuprofen, or other minor pain relievers, such as acetaminophen, are often recommended.  Do not take pain medication within 7 days before surgery.  Prescription pain relievers may be given if deemed necessary by your caregiver. Use only as directed and only as much as you need.  Corticosteroid injections may be given by your caregiver. These injections should be reserved for the most serious cases, because they may only be given a certain number of times.  HEAT AND COLD  Cold treatment (icing) relieves pain and reduces inflammation. Cold treatment should be applied for 10 to 15 minutes every 2 to 3 hours for inflammation and pain and immediately after any activity that aggravates your symptoms. Use ice packs or massage the area with a piece of ice (ice massage).  Heat treatment may be used prior to performing the stretching and strengthening activities prescribed by your caregiver, physical therapist, or athletic trainer. Use a heat pack or soak the injury in warm water.  SEEK IMMEDIATE MEDICAL   CARE IF:  Treatment seems to offer no benefit, or the condition worsens.  Any medications produce adverse side effects.   EXERCISES- RANGE OF MOTION (ROM) AND STRETCHING EXERCISES - Plantar Fasciitis (Heel Spur Syndrome) These exercises may help you when beginning to rehabilitate your injury. Your symptoms may resolve with or without further involvement from your physician, physical therapist or athletic trainer. While completing these exercises, remember:   Restoring tissue flexibility helps normal motion to return to the joints. This allows healthier, less painful movement and activity.  An effective stretch should be held for at least 30 seconds.  A stretch should never be painful. You should only feel a gentle lengthening or release in the stretched tissue.  RANGE OF MOTION - Toe Extension, Flexion  Sit with your right / left leg crossed over your opposite knee.  Grasp your toes and gently pull them back toward the top of your foot. You should feel a stretch on the bottom of your toes and/or foot.  Hold this stretch for 10 seconds.  Now, gently pull your toes toward the bottom of your foot. You should feel a stretch on the top of your toes and or foot.  Hold this stretch for 10 seconds. Repeat  times. Complete this stretch 3 times per day.   RANGE OF MOTION - Ankle Dorsiflexion, Active Assisted  Remove shoes and sit on a chair that is preferably not on a carpeted surface.  Place right / left foot under knee. Extend your opposite leg for support.  Keeping your heel down, slide your right / left foot back toward the chair until you feel a stretch at your ankle or calf. If you do not feel a stretch, slide your bottom forward to the edge of the chair, while still keeping your heel down.  Hold this stretch for 10 seconds. Repeat 3 times. Complete this stretch 2 times per day.   STRETCH  Gastroc, Standing  Place hands on wall.  Extend right / left leg, keeping the front knee somewhat bent.  Slightly point your toes inward on your back foot.  Keeping your right / left heel on the floor and your  knee straight, shift your weight toward the wall, not allowing your back to arch.  You should feel a gentle stretch in the right / left calf. Hold this position for 10 seconds. Repeat 3 times. Complete this stretch 2 times per day.  STRETCH  Soleus, Standing  Place hands on wall.  Extend right / left leg, keeping the other knee somewhat bent.  Slightly point your toes inward on your back foot.  Keep your right / left heel on the floor, bend your back knee, and slightly shift your weight over the back leg so that you feel a gentle stretch deep in your back calf.  Hold this position for 10 seconds. Repeat 3 times. Complete this stretch 2 times per day.  STRETCH  Gastrocsoleus, Standing  Note: This exercise can place a lot of stress on your foot and ankle. Please complete this exercise only if specifically instructed by your caregiver.   Place the ball of your right / left foot on a step, keeping your other foot firmly on the same step.  Hold on to the wall or a rail for balance.  Slowly lift your other foot, allowing your body weight to press your heel down over the edge of the step.  You should feel a stretch in your right / left calf.  Hold this   position for 10 seconds.  Repeat this exercise with a slight bend in your right / left knee. Repeat 3 times. Complete this stretch 2 times per day.   STRENGTHENING EXERCISES - Plantar Fasciitis (Heel Spur Syndrome)  These exercises may help you when beginning to rehabilitate your injury. They may resolve your symptoms with or without further involvement from your physician, physical therapist or athletic trainer. While completing these exercises, remember:   Muscles can gain both the endurance and the strength needed for everyday activities through controlled exercises.  Complete these exercises as instructed by your physician, physical therapist or athletic trainer. Progress the resistance and repetitions only as guided.  STRENGTH -  Towel Curls  Sit in a chair positioned on a non-carpeted surface.  Place your foot on a towel, keeping your heel on the floor.  Pull the towel toward your heel by only curling your toes. Keep your heel on the floor. Repeat 3 times. Complete this exercise 2 times per day.  STRENGTH - Ankle Inversion  Secure one end of a rubber exercise band/tubing to a fixed object (table, pole). Loop the other end around your foot just before your toes.  Place your fists between your knees. This will focus your strengthening at your ankle.  Slowly, pull your big toe up and in, making sure the band/tubing is positioned to resist the entire motion.  Hold this position for 10 seconds.  Have your muscles resist the band/tubing as it slowly pulls your foot back to the starting position. Repeat 3 times. Complete this exercises 2 times per day.  Document Released: 04/02/2005 Document Revised: 06/25/2011 Document Reviewed: 07/15/2008 [employer] Patient Information 2014 [employer]. 

## 2020-03-07 NOTE — Progress Notes (Signed)
Subjective:   Patient ID: Jane Hill, female   DOB: 49 y.o.   MRN: [id number]   HPI 49 year old female presents the office for concerns of sharp pain in the bottom of her right heel which is been on the last 2 months.  The pain has been intermittent.  She is points to the medial aspect where she gets some sharp pain going to the ankle but as well however intermittently.  She has pain mostly the morning when she first gets up.  She denies recent injury or trauma.  No swelling or redness.  No recent falls or injury.  No other concerns.  Review of Systems  All other systems reviewed and are negative.  Past Medical History:  Diagnosis Date  . Carpal tunnel syndrome 2004   R  . Community acquired MRSA infection 2008  . Dyspnea    with exertion  . Fibrocystic breast   . Hypothyroidism   . Kidney stones 12/2015  . Left shoulder pain     12/2015  . Migraine     Past Surgical History:  Procedure Laterality Date  . CYSTOSCOPY N/A 06/25/2016   Procedure: CYSTOSCOPY;  Surgeon: Megan Salon, MD;  Location: [facility];  Service: Gynecology;  Laterality: N/A;  . DIAGNOSTIC LAPAROSCOPY  1995   LOA, endometriosis  . LAPAROSCOPIC BILATERAL SALPINGECTOMY Bilateral 06/25/2016   Procedure: LAPAROSCOPIC BILATERAL SALPINGECTOMY Poss BSO;  Surgeon: Megan Salon, MD;  Location: [facility];  Service: Gynecology;  Laterality: Bilateral;  . LAPAROSCOPIC HYSTERECTOMY N/A 06/25/2016   Procedure: HYSTERECTOMY TOTAL LAPAROSCOPIC;  Surgeon: Megan Salon, MD;  Location: [facility];  Service: Gynecology;  Laterality: N/A;     Current Outpatient Medications:  .  albuterol (PROVENTIL) (2.5 MG/3ML) 0.083% nebulizer solution, Take 2.5 mg by nebulization every 6 (six) hours as needed for wheezing or shortness of breath. (Patient not taking: Reported on 01/15/2020), Disp: , Rfl:  .  b complex vitamins tablet, Take by mouth., Disp: , Rfl:  .  clobetasol ointment (TEMOVATE) 0.05 %, Apply topically 2 (two) times daily. Can use  for up to 5 days., Disp: 60 g, Rfl: 1 .  levothyroxine (SYNTHROID) 100 MCG tablet, Take 100 mcg by mouth daily., Disp: , Rfl:  .  levothyroxine (SYNTHROID) 88 MCG tablet, Take 1 tablet by mouth daily., Disp: , Rfl:  .  meloxicam (MOBIC) 15 MG tablet, Take 1 tablet (15 mg total) by mouth daily., Disp: 30 tablet, Rfl: 0 .  mometasone (ELOCON) 0.1 % ointment, Apply topically twice weekly., Disp: 45 g, Rfl: 3 .  rosuvastatin (CRESTOR) 5 MG tablet, Take 5 mg by mouth daily., Disp: , Rfl:  .  SUMAtriptan (IMITREX) 100 MG tablet, Take 100 mg by mouth every 2 (two) hours as needed for migraine. May repeat in 2 hours if headache persists or recurs. (Patient not taking: Reported on 01/15/2020), Disp: , Rfl:   Allergies  Allergen Reactions  . Estring [Estradiol] Rash        Objective:  Physical Exam  General: AAO x3, NAD  Dermatological: Skin is warm, dry and supple bilateral.There are no open sores, no preulcerative lesions, no rash or signs of infection present.  Vascular: Dorsalis Pedis artery and Posterior Tibial artery pedal pulses are 2/4 bilateral with immedate capillary fill time.  There is no pain with calf compression, swelling, warmth, erythema.   Neruologic: Grossly intact via light touch bilateral.  Negative Tinel's sign.  Musculoskeletal: There is tenderness palpation on plantar medial tubercle of the calcaneus at  the insertion of plantar fascial right side.  No pain on the arch of the foot.  No discomfort along the medial aspect the ankle and the flexor tendons but overall tendons appear to be intact.  Muscular strength 5/5 in all groups tested bilateral.  Gait: Unassisted, Nonantalgic.       Assessment:   49 year old female with plantar fasciitis, tendinitis    Plan:  -Treatment options discussed including all alternatives, risks, and complications -Etiology of symptoms were discussed -X-rays were obtained and reviewed with the patient.  No subacute fracture or stress  fracture. -Discussed steroid injection today. -Prescribed mobic. Discussed side effects of the medication and directed to stop if any are to occur and call the office.  -Plantar fascial brace dispensed -Stretching, icing daily -Discussed shoe modifications and orthotics   Trula Slade DPM

## 2020-04-12 ENCOUNTER — Ambulatory Visit: Payer: 59 | Admitting: Podiatry

## 2020-07-15 ENCOUNTER — Other Ambulatory Visit: Payer: Self-pay | Admitting: Obstetrics & Gynecology

## 2020-07-15 DIAGNOSIS — Z1231 Encounter for screening mammogram for malignant neoplasm of breast: Secondary | ICD-10-CM

## 2020-09-06 ENCOUNTER — Ambulatory Visit
Admission: RE | Admit: 2020-09-06 | Discharge: 2020-09-06 | Disposition: A | Discharge status: Home / Self Care | Payer: 59 | Source: Ambulatory Visit | Attending: Obstetrics & Gynecology | Admitting: Obstetrics & Gynecology

## 2020-09-06 ENCOUNTER — Other Ambulatory Visit: Payer: Self-pay

## 2020-09-06 DIAGNOSIS — Z1231 Encounter for screening mammogram for malignant neoplasm of breast: Secondary | ICD-10-CM

## 2021-02-06 ENCOUNTER — Ambulatory Visit (INDEPENDENT_AMBULATORY_CARE_PROVIDER_SITE_OTHER): Payer: 59 | Admitting: Obstetrics & Gynecology

## 2021-02-06 ENCOUNTER — Encounter (HOSPITAL_BASED_OUTPATIENT_CLINIC_OR_DEPARTMENT_OTHER): Payer: Self-pay | Admitting: Obstetrics & Gynecology

## 2021-02-06 ENCOUNTER — Other Ambulatory Visit: Payer: Self-pay

## 2021-02-06 VITALS — BP 149/90 | HR 88 | Ht 66.0 in | Wt 272.0 lb

## 2021-02-06 DIAGNOSIS — Z1211 Encounter for screening for malignant neoplasm of colon: Secondary | ICD-10-CM | POA: Diagnosis not present

## 2021-02-06 DIAGNOSIS — Z01419 Encounter for gynecological examination (general) (routine) without abnormal findings: Secondary | ICD-10-CM | POA: Diagnosis not present

## 2021-02-06 DIAGNOSIS — Z90710 Acquired absence of both cervix and uterus: Secondary | ICD-10-CM | POA: Diagnosis not present

## 2021-02-06 DIAGNOSIS — K649 Unspecified hemorrhoids: Secondary | ICD-10-CM | POA: Diagnosis not present

## 2021-02-06 DIAGNOSIS — L900 Lichen sclerosus et atrophicus: Secondary | ICD-10-CM

## 2021-02-06 DIAGNOSIS — G43009 Migraine without aura, not intractable, without status migrainosus: Secondary | ICD-10-CM

## 2021-02-06 DIAGNOSIS — E039 Hypothyroidism, unspecified: Secondary | ICD-10-CM | POA: Diagnosis not present

## 2021-02-06 MED ORDER — HYDROCORTISONE ACETATE 25 MG RE SUPP: 25.0000 mg | Freq: Two times a day (BID) | RECTAL | 2 refills | Status: DC

## 2021-02-06 MED ORDER — MOMETASONE FUROATE 0.1 % EX OINT: TOPICAL_OINTMENT | CUTANEOUS | 3 refills | Status: DC

## 2021-02-06 NOTE — Progress Notes (Signed)
50 y.o. G0P0000 Divorced White or Caucasian female here for annual exam.  Has been a year since her niece died last year.  She had CP.  Pt had a really close relationship with her.    Parents live with her.    Denies vaginal bleeding.  Having some issues with hemorrhoids.  Does have bleeding with wiping.  Does have pain.  Has used preparation H and witch hazel and something else she's taking orally (OTC product).    Denies vaginal bleeding.  She does having some urinary leakage.  Has done physical therapy.   Having some mild night sweats.    Patient's last menstrual period was 06/12/2016 (exact date).          Sexually active: Yes.    The current method of family planning is status post hysterectomy.    Exercising: No.  Smoker:  no  Health Maintenance: Pap:  04/30/2016 Negative, neg HR HPV History of abnormal Pap:  ASCUS pap 2015 MMG:  09/06/2020 Negative Colonoscopy:  referral placed Screening Labs: 2 weeks ago with Dr. Dory Larsen.  Has recheck scheduled about 1 month.   reports that she has never smoked. She has never used smokeless tobacco. She reports current alcohol use of about 1.0 standard drink per week. She reports that she does not use drugs.  Past Medical History:  Diagnosis Date   Carpal tunnel syndrome 2004   right   Community acquired MRSA infection 2008   Dyspnea    with exertion   Fibrocystic breast    Hypothyroidism    Kidney stones 12/2015   Left shoulder pain     12/2015   Migraine     Past Surgical History:  Procedure Laterality Date   CYSTOSCOPY N/A 06/25/2016   Procedure: CYSTOSCOPY;  Surgeon: Megan Salon, MD;  Location: [facility];  Service: Gynecology;  Laterality: N/A;   DIAGNOSTIC LAPAROSCOPY  1995   LOA, endometriosis   LAPAROSCOPIC BILATERAL SALPINGECTOMY Bilateral 06/25/2016   Procedure: LAPAROSCOPIC BILATERAL SALPINGECTOMY Poss BSO;  Surgeon: Megan Salon, MD;  Location: [facility];  Service: Gynecology;  Laterality: Bilateral;   LAPAROSCOPIC  HYSTERECTOMY N/A 06/25/2016   Procedure: HYSTERECTOMY TOTAL LAPAROSCOPIC;  Surgeon: Megan Salon, MD;  Location: [facility];  Service: Gynecology;  Laterality: N/A;    Current Outpatient Medications  Medication Sig Dispense Refill   albuterol (PROVENTIL) (2.5 MG/3ML) 0.083% nebulizer solution Take 2.5 mg by nebulization every 6 (six) hours as needed for wheezing or shortness of breath.     b complex vitamins tablet Take by mouth.     clobetasol ointment (TEMOVATE) 0.05 % Apply topically 2 (two) times daily. Can use for up to 5 days. 60 g 1   rosuvastatin (CRESTOR) 5 MG tablet Take 5 mg by mouth daily.     SUMAtriptan (IMITREX) 100 MG tablet Take 100 mg by mouth every 2 (two) hours as needed for migraine. May repeat in 2 hours if headache persists or recurs.     meloxicam (MOBIC) 15 MG tablet Take 1 tablet (15 mg total) by mouth daily. (Patient not taking: Reported on 02/06/2021) 30 tablet 0   mometasone (ELOCON) 0.1 % ointment Apply topically twice weekly. (Patient not taking: Reported on 02/06/2021) 45 g 3   No current facility-administered medications for this visit.    Family History  Problem Relation Age of Onset   Breast cancer Mother 7       02/2013   Cancer Paternal Grandmother 59  breast   Breast cancer Paternal Grandmother 36   Heart disease Maternal Grandmother    Heart disease Maternal Grandfather     Review of Systems  Exam:   BP (!) 149/90 (BP Location: Right Arm, Patient Position: Standing, Cuff Size: Large)   Pulse 88   Ht 5\' 6"  (1.676 m)   Wt 272 lb (123.4 kg)   LMP 06/12/2016 (Exact Date)   BMI 43.90 kg/m   Height: 5\' 6"  (167.6 cm)  General appearance: alert, cooperative and appears stated age Head: Normocephalic, without obvious abnormality, atraumatic Neck: no adenopathy, supple, symmetrical, trachea midline and thyroid normal to inspection and palpation Lungs: clear to auscultation bilaterally Breasts: normal appearance, no masses or  tenderness Heart: regular rate and rhythm Abdomen: soft, non-tender; bowel sounds normal; no masses,  no organomegaly Extremities: extremities normal, atraumatic, no cyanosis or edema Skin: Skin color, texture, turgor normal. No rashes or lesions Lymph nodes: Cervical, supraclavicular, and axillary nodes normal. No abnormal inguinal nodes palpated Neurologic: Grossly normal  Pelvic: External genitalia:  no lesions              Urethra:  normal appearing urethra with no masses, tenderness or lesions              Bartholins and Skenes: normal                 Vagina: normal appearing vagina with normal color and no discharge, no lesions              Cervix: absent              Pap taken: No. Bimanual Exam:  Uterus:  uterus absent              Adnexa: normal adnexa and no mass, fullness, tenderness               Rectovaginal: Confirms               Anus:  normal sphincter tone, internal hemorrhoid at 11 o'clock noted  Chaperone, Octaviano Batty, CMA, was present for exam.  Assessment/Plan: 1. Well woman exam with routine gynecological exam - pap smear not indicated - MMG up to date - colonoscopy referral placed - care gaps reviewed and updated - lab work done with PCP.  I cannot see results.  2. Colon cancer screening - Ambulatory referral to Gastroenterology  3. Lichen sclerosus - d/w pt using lower potency steroid with more frequency for symptom control.  Does not need RF for clobetasol.  Advised to try and use for less than 5 days each month - mometasone (ELOCON) 0.1 % ointment; Apply topically twice weekly.  Dispense: 45 g; Refill: 3  4. H/O: hysterectomy - TLH/bilateral salpingectomy 06/2016  5. Migraine without aura and without status migrainosus, not intractable - followed by Dr. Sima Matas  6. Acquired hypothyroidism  7. Hemorrhoids, unspecified hemorrhoid type - discussed with pt this may possibly be treated with banding.   - hydrocortisone (ANUSOL-HC) 25 MG suppository;  Place 1 suppository (25 mg total) rectally 2 (two) times daily.  Dispense: 15 suppository; Refill: 2

## 2021-03-02 ENCOUNTER — Encounter (HOSPITAL_BASED_OUTPATIENT_CLINIC_OR_DEPARTMENT_OTHER): Payer: Self-pay | Admitting: Obstetrics & Gynecology

## 2021-03-06 ENCOUNTER — Telehealth: Payer: Self-pay

## 2021-03-06 ENCOUNTER — Encounter: Payer: Self-pay | Admitting: Internal Medicine

## 2021-03-06 DIAGNOSIS — K649 Unspecified hemorrhoids: Secondary | ICD-10-CM | POA: Insufficient documentation

## 2021-03-06 NOTE — Telephone Encounter (Signed)
----- Message from Gatha Mayer, MD sent at 03/06/2021  1:13 PM EST ----- Regarding: FW: referral Syrenity Klepacki,  Please give this patient a previsit and direct colonoscopy appt for screening ----- Message ----- From: Megan Salon, MD Sent: 03/06/2021   9:43 AM EST To: Gatha Mayer, MD Subject: RE: referral                                   Thanks so much for the response.  I reached out to her and she is happy to just be scheduled and get the procedure done.  Please have your CMA reach out to her when convenient.  She knows your office will be calling.  Thanks again!  Vinnie Level  ----- Message ----- From: Gatha Mayer, MD Sent: 03/03/2021   5:05 PM EST To: Sirius Woodford E Martinique, CMA, Megan Salon, MD Subject: RE: referral                                   I am not sure what happened but should not have been told they needed to see a surgeon. Could be that they heard about rectal bleeding and would not set up an open access screening. We do try to sort out signs and sxs but I am comfortable if you have diagnosed hemorrhoids we can schedule her for screening.  I will try to improve things and if she is willing we will contact her and arrange a screening colonoscopy.  Apology about filling inbox not necessary. Always a pleasure to help you and your patients and I need to know these things.  Just let us know if she is ok with scheduling with us/me and my CMA (cced) will reach out and set it up.  Best regards,  Glendell Docker ----- Message ----- From: Megan Salon, MD Sent: 03/03/2021   1:49 PM EST To: Gatha Mayer, MD Subject: referral                                       Dr. Carlean Purl, Sorry to bother you with this but I referred this 50 yo insured reasonable pt to [facility] for a screening colonoscopy.  She has hemorrhoids and has some intermittent bright red bleeding from time to time.  I started her on hydrocortisone suppositories and witch hazel to help.  Her hemorrhoids are internal and I  think could be treated with banding (of course this is just my assessment and I do not do these procedures).  This pt was told she could not be seen until she had a general surgery consultation for hemorrhoids.  I know Dr. Marcello Moores well enough to know she would not recommend surgical excision for this pt's hemorrhoids.  This pt is requesting referral to [facility] and I will do it for her but I thought I'd reach out first and see if there was something I was missing here?  Maybe doing the general surgery referral first prevents the colonoscopy from being done as a problem which would go towards her deductible?    Thanks for any input.  Again, sorry to fill up your inbox.  I'd attached the pt's name in case you need to look more in depth into this pt's communications.  Vinnie Level  Sabra Heck

## 2021-03-06 NOTE — Telephone Encounter (Signed)
I left Jane Hill a detailed message to call us back at her convenience to set up a pre-visit and screening colonoscopy appointment.

## 2021-03-07 ENCOUNTER — Encounter: Payer: Self-pay | Admitting: Internal Medicine

## 2021-03-07 NOTE — Telephone Encounter (Signed)
Patient called back and set up her appointments.

## 2021-04-26 ENCOUNTER — Ambulatory Visit (AMBULATORY_SURGERY_CENTER): Payer: Self-pay | Admitting: *Deleted

## 2021-04-26 VITALS — Ht 66.0 in | Wt 275.0 lb

## 2021-04-26 DIAGNOSIS — Z1211 Encounter for screening for malignant neoplasm of colon: Secondary | ICD-10-CM

## 2021-04-26 NOTE — Progress Notes (Signed)

## 2021-05-01 ENCOUNTER — Encounter: Payer: Self-pay | Admitting: Internal Medicine

## 2021-05-05 ENCOUNTER — Telehealth: Payer: Self-pay | Admitting: Internal Medicine

## 2021-05-05 NOTE — Telephone Encounter (Signed)
Patient called.  She has a procedure scheduled for 1/25 with Dr. Carlean Purl.  She went to the doctor and has been diagnosed with bronchitis (she does not have COVID) and has been put on prednisone for the rest of the week.  She is questioning whether or not she should reschedule her procedure to another day.  Please call patient and advise.  Thank you.

## 2021-05-05 NOTE — Telephone Encounter (Signed)
I gave the patient a choice to say on schedule to see if she feels better over the weekend and then call us Monday to reschedule if not better but patient request to reschedule colonoscopy now-new appointment made for 3/16, new prep instructions sent to mychart-pt aware. Patient will call us back if any medical hx changes occur.

## 2021-05-10 ENCOUNTER — Encounter: Payer: 59 | Admitting: Internal Medicine

## 2021-06-29 ENCOUNTER — Encounter: Payer: Self-pay | Admitting: Internal Medicine

## 2021-06-29 ENCOUNTER — Other Ambulatory Visit: Payer: Self-pay | Admitting: Internal Medicine

## 2021-06-29 ENCOUNTER — Ambulatory Visit (AMBULATORY_SURGERY_CENTER): Payer: 59 | Admitting: Internal Medicine

## 2021-06-29 ENCOUNTER — Other Ambulatory Visit: Payer: Self-pay

## 2021-06-29 VITALS — BP 126/96 | HR 65 | Temp 97.7°F | Resp 22 | Ht 66.0 in | Wt 275.0 lb

## 2021-06-29 DIAGNOSIS — Z1211 Encounter for screening for malignant neoplasm of colon: Secondary | ICD-10-CM

## 2021-06-29 DIAGNOSIS — K635 Polyp of colon: Secondary | ICD-10-CM

## 2021-06-29 MED ORDER — SODIUM CHLORIDE 0.9 % IV SOLN: 500.0000 mL | Freq: Once | INTRAVENOUS | Status: DC

## 2021-06-29 NOTE — Op Note (Signed)
[facility] ?Patient Name: Jane Hill ?Procedure Date: 06/29/2021 8:03 AM ?MRN: [id number] ?Endoscopist: Gatha Mayer , MD ?Age: 51 ?Referring MD:  ?Date of Birth: 1970-10-26 ?Gender: Female ?Account #: [id number] ?Procedure:                Colonoscopy ?Indications:              Screening for colorectal malignant neoplasm ?Medicines:                Propofol per Anesthesia, Monitored Anesthesia Care ?Procedure:                Pre-Anesthesia Assessment: ?                          - Prior to the procedure, a History and Physical  ?                          was performed, and patient medications and  ?                          allergies were reviewed. The patient's tolerance of  ?                          previous anesthesia was also reviewed. The risks  ?                          and benefits of the procedure and the sedation  ?                          options and risks were discussed with the patient.  ?                          All questions were answered, and informed consent  ?                          was obtained. Prior Anticoagulants: The patient has  ?                          taken no previous anticoagulant or antiplatelet  ?                          agents. ASA Grade Assessment: III - A patient with  ?                          severe systemic disease. After reviewing the risks  ?                          and benefits, the patient was deemed in  ?                          satisfactory condition to undergo the procedure. ?                          After obtaining informed consent, the colonoscope  ?  was passed under direct vision. Throughout the  ?                          procedure, the patient's blood pressure, pulse, and  ?                          oxygen saturations were monitored continuously. The  ?                          CF [id number] #[id number] was introduced through the anus  ?                          and advanced to the the cecum, identified by  ?                           appendiceal orifice and ileocecal valve. The  ?                          colonoscopy was performed without difficulty. The  ?                          patient tolerated the procedure well. The quality  ?                          of the bowel preparation was excellent. The  ?                          ileocecal valve, appendiceal orifice, and rectum  ?                          were photographed. The bowel preparation used was  ?                          Miralax via split dose instruction. ?Scope In: 8:10:19 AM ?Scope Out: 8:23:12 AM ?Scope Withdrawal Time: 0 hours 11 minutes 3 seconds  ?Total Procedure Duration: 0 hours 12 minutes 53 seconds  ?Findings:                 The perianal and digital rectal examinations were  ?                          normal. ?                          A diminutive polyp was found in the sigmoid colon.  ?                          The polyp was sessile. The polyp was removed with a  ?                          cold snare. Resection and retrieval were complete.  ?                          Verification of patient identification for the  ?  specimen was done. Estimated blood loss was minimal. ?                          The exam was otherwise without abnormality on  ?                          direct and retroflexion views. ?Complications:            No immediate complications. ?Estimated Blood Loss:     Estimated blood loss was minimal. ?Impression:               - One diminutive polyp in the sigmoid colon,  ?                          removed with a cold snare. Resected and retrieved. ?                          - The examination was otherwise normal on direct  ?                          and retroflexion views. ?Recommendation:           - Patient has a contact number available for  ?                          emergencies. The signs and symptoms of potential  ?                          delayed complications were discussed with the  ?                           patient. Return to normal activities tomorrow.  ?                          Written discharge instructions were provided to the  ?                          patient. ?                          - Resume previous diet. ?                          - Continue present medications. ?                          - Await pathology results. ?                          - Repeat colonoscopy is recommended. The  ?                          colonoscopy date will be determined after pathology  ?                          results from today's exam become available for  ?  review. ?Gatha Mayer, MD ?06/29/2021 8:34:43 AM ?This report has been signed electronically. ?

## 2021-06-29 NOTE — Patient Instructions (Addendum)
I found and removed one tiny little polyp. ?I will let you know pathology results and when to have another routine colonoscopy by mail and/or My Chart. ? ?I appreciate the opportunity to care for you. ?Gatha Mayer, MD, [facility] ? ? ? ?YOU HAD AN ENDOSCOPIC PROCEDURE TODAY AT [facility]:   Refer to the procedure report that was given to you for any specific questions about what was found during the examination.  If the procedure report does not answer your questions, please call your gastroenterologist to clarify.  If you requested that your care partner not be given the details of your procedure findings, then the procedure report has been included in a sealed envelope for you to review at your convenience later. ? ?**Handout given on polyps** ? ?YOU SHOULD EXPECT: Some feelings of bloating in the abdomen. Passage of more gas than usual.  Walking can help get rid of the air that was put into your GI tract during the procedure and reduce the bloating. If you had a lower endoscopy (such as a colonoscopy or flexible sigmoidoscopy) you may notice spotting of blood in your stool or on the toilet paper. If you underwent a bowel prep for your procedure, you may not have a normal bowel movement for a few days. ? ?Please Note:  You might notice some irritation and congestion in your nose or some drainage.  This is from the oxygen used during your procedure.  There is no need for concern and it should clear up in a day or so. ? ?SYMPTOMS TO REPORT IMMEDIATELY: ? ?Following lower endoscopy (colonoscopy or flexible sigmoidoscopy): ? Excessive amounts of blood in the stool ? Significant tenderness or worsening of abdominal pains ? Swelling of the abdomen that is new, acute ? Fever of 100?F or higher ? ?For urgent or emergent issues, a gastroenterologist can be reached at any hour by calling [phone]. ?Do not use MyChart messaging for urgent concerns.  ? ? ?DIET:  We do recommend a small meal at first, but  then you may proceed to your regular diet.  Drink plenty of fluids but you should avoid alcoholic beverages for 24 hours. ? ?ACTIVITY:  You should plan to take it easy for the rest of today and you should NOT DRIVE or use heavy machinery until tomorrow (because of the sedation medicines used during the test).   ? ?FOLLOW UP: ?Our staff will call the number listed on your records 48-72 hours following your procedure to check on you and address any questions or concerns that you may have regarding the information given to you following your procedure. If we do not reach you, we will leave a message.  We will attempt to reach you two times.  During this call, we will ask if you have developed any symptoms of COVID 19. If you develop any symptoms (ie: fever, flu-like symptoms, shortness of breath, cough etc.) before then, please call [phone].  If you test positive for Covid 19 in the 2 weeks post procedure, please call and report this information to [place].   ? ?If any biopsies were taken you will be contacted by phone or by letter within the next 1-3 weeks.  Please call us at [phone] if you have not heard about the biopsies in 3 weeks.  ? ? ?SIGNATURES/CONFIDENTIALITY: ?You and/or your care partner have signed paperwork which will be entered into your electronic medical record.  These signatures attest to the fact that that the  information above on your After Visit Summary has been reviewed and is understood.  Full responsibility of the confidentiality of this discharge information lies with you and/or your care-partner.  ?

## 2021-06-29 NOTE — Progress Notes (Signed)
[facility] History and Physical ? ? ?Primary Care Physician:  Finis Bud, MD ? ? ?Reason for Procedure:   CRCA screen ? ?Plan:    colonoscopy ? ? ? ? ?HPI: Jane Hill is a 51 y.o. female here for CRCA screening ? ? ?Past Medical History:  ?Diagnosis Date  ? Carpal tunnel syndrome 2004  ? right  ? Community acquired MRSA infection 2008  ? Dyspnea   ? with exertion  ? Fibrocystic breast   ? GERD (gastroesophageal reflux disease)   ? Hyperlipidemia   ? Hypothyroidism   ? Kidney stones 12/2015  ? Left shoulder pain   ?  12/2015  ? Migraine   ? ? ?Past Surgical History:  ?Procedure Laterality Date  ? COLONOSCOPY  06/29/2021  ? CYSTOSCOPY N/A 06/25/2016  ? Procedure: CYSTOSCOPY;  Surgeon: Megan Salon, MD;  Location: [facility];  Service: Gynecology;  Laterality: N/A;  ? DIAGNOSTIC LAPAROSCOPY  1995  ? LOA, endometriosis  ? LAPAROSCOPIC BILATERAL SALPINGECTOMY Bilateral 06/25/2016  ? Procedure: LAPAROSCOPIC BILATERAL SALPINGECTOMY Poss BSO;  Surgeon: Megan Salon, MD;  Location: [facility];  Service: Gynecology;  Laterality: Bilateral;  ? LAPAROSCOPIC HYSTERECTOMY N/A 06/25/2016  ? Procedure: HYSTERECTOMY TOTAL LAPAROSCOPIC;  Surgeon: Megan Salon, MD;  Location: [facility];  Service: Gynecology;  Laterality: N/A;  ? ? ?Prior to Admission medications   ?Medication Sig Start Date End Date Taking? Authorizing Provider  ?b complex vitamins tablet Take by mouth.   Yes [provider]  ?clobetasol ointment (TEMOVATE) 0.05 % Apply topically 2 (two) times daily. Can use for up to 5 days. 01/15/20  Yes Megan Salon, MD  ?hydrocortisone (ANUSOL-HC) 25 MG suppository Place 1 suppository (25 mg total) rectally 2 (two) times daily. 02/06/21  Yes Megan Salon, MD  ?levothyroxine (SYNTHROID) 112 MCG tablet Take 112 mcg by mouth daily before breakfast. M,W.Fri   Yes [provider]  ?LEVOTHYROXINE SODIUM PO Take 110 mcg by mouth. Tu,Thu,Sat,Sun   Yes [provider]  ?rosuvastatin  (CRESTOR) 5 MG tablet Take 5 mg by mouth daily. 02/24/20  Yes [provider]  ?Acetaminophen (TYLENOL PO) Take 500 mg by mouth every 6 (six) hours as needed.    [provider]  ?albuterol (PROVENTIL) (2.5 MG/3ML) 0.083% nebulizer solution Take 2.5 mg by nebulization every 6 (six) hours as needed for wheezing or shortness of breath.    [provider]  ?mometasone (ELOCON) 0.1 % ointment Apply topically twice weekly. 02/06/21   Megan Salon, MD  ?SUMAtriptan (IMITREX) 100 MG tablet Take 100 mg by mouth every 2 (two) hours as needed for migraine. May repeat in 2 hours if headache persists or recurs.    [provider]  ? ? ?Current Outpatient Medications  ?Medication Sig Dispense Refill  ? b complex vitamins tablet Take by mouth.    ? clobetasol ointment (TEMOVATE) 0.05 % Apply topically 2 (two) times daily. Can use for up to 5 days. 60 g 1  ? hydrocortisone (ANUSOL-HC) 25 MG suppository Place 1 suppository (25 mg total) rectally 2 (two) times daily. 15 suppository 2  ? levothyroxine (SYNTHROID) 112 MCG tablet Take 112 mcg by mouth daily before breakfast. M,W.Fri    ? LEVOTHYROXINE SODIUM PO Take 110 mcg by mouth. Tu,Thu,Sat,Sun    ? rosuvastatin (CRESTOR) 5 MG tablet Take 5 mg by mouth daily.    ? Acetaminophen (TYLENOL PO) Take 500 mg by mouth every 6 (six) hours as needed.    ?  albuterol (PROVENTIL) (2.5 MG/3ML) 0.083% nebulizer solution Take 2.5 mg by nebulization every 6 (six) hours as needed for wheezing or shortness of breath.    ? mometasone (ELOCON) 0.1 % ointment Apply topically twice weekly. 45 g 3  ? SUMAtriptan (IMITREX) 100 MG tablet Take 100 mg by mouth every 2 (two) hours as needed for migraine. May repeat in 2 hours if headache persists or recurs.    ? ?Current Facility-Administered Medications  ?Medication Dose Route Frequency Provider Last Rate Last Admin  ? 0.9 %  sodium chloride infusion  500 mL Intravenous Once Gatha Mayer, MD      ? ? ?Allergies as of  06/29/2021 - Review Complete 06/29/2021  ?Allergen Reaction Noted  ? Estring [estradiol] Rash 10/15/2012  ? ? ?Family History  ?Problem Relation Age of Onset  ? Breast cancer Mother 92  ?     02/2013  ? Esophageal cancer Maternal Uncle   ? Heart disease Maternal Grandmother   ? Heart disease Maternal Grandfather   ? Cancer Paternal Grandmother 75  ?     breast  ? Breast cancer Paternal Grandmother 69  ? Colon polyps Neg Hx   ? Colon cancer Neg Hx   ? Rectal cancer Neg Hx   ? Stomach cancer Neg Hx   ? ? ?Social History  ? ?Socioeconomic History  ? Marital status: Divorced  ?  Spouse name: Not on file  ? Number of children: Not on file  ? Years of education: Not on file  ? Highest education level: Not on file  ?Occupational History  ? Not on file  ?Tobacco Use  ? Smoking status: Never  ? Smokeless tobacco: Never  ?Vaping Use  ? Vaping Use: Never used  ?Substance and Sexual Activity  ? Alcohol use: Yes  ?  Alcohol/week: 1.0 standard drink  ?  Types: 1 Standard drinks or equivalent per week  ? Drug use: Never  ? Sexual activity: Not Currently  ?  Partners: Male  ?  Birth control/protection: Surgical  ?  Comment: Hysterectomy  ?Other Topics Concern  ? Not on file  ?Social History Narrative  ? Not on file  ? ?Social Determinants of Health  ? ?Financial Resource Strain: Not on file  ?Food Insecurity: Not on file  ?Transportation Needs: Not on file  ?Physical Activity: Not on file  ?Stress: Not on file  ?Social Connections: Not on file  ?Intimate Partner Violence: Not on file  ? ? ?Review of Systems: ? ?All other review of systems negative except as mentioned in the HPI. ? ?Physical Exam: ?Vital signs ?BP (!) 153/98   Pulse 82   Temp 97.7 ?F (36.5 ?C) (Temporal)   Resp 18   Ht '5\' 6"'$  (1.676 m)   Wt 275 lb (124.7 kg)   LMP 06/12/2016 (Exact Date)   SpO2 100%   BMI 44.39 kg/m?  ? ?General:   Alert,  Well-developed, well-nourished, pleasant and cooperative in NAD ?Lungs:  Clear throughout to auscultation.   ?Heart:   Regular rate and rhythm; no murmurs, clicks, rubs,  or gallops. ?Abdomen:  Soft, nontender and nondistended. Normal bowel sounds.   ?Neuro/Psych:  Alert and cooperative. Normal mood and affect. A and O x 3 ? ? ?'@Rakiya Krawczyk'$  Simonne Maffucci, MD, [facility] Regal ?[facility] ?[phone] (pager) ?06/29/2021 8:07 AM@ ? ?

## 2021-06-29 NOTE — Progress Notes (Signed)
PT taken to PACU. Monitors in place. VSS. Report given to RN. 

## 2021-06-29 NOTE — Progress Notes (Signed)
Pt's states no medical or surgical changes since previsit or office visit.  ? ?Vitals JD ?

## 2021-06-29 NOTE — Progress Notes (Signed)
Called to room to assist during endoscopic procedure.  Patient ID and intended procedure confirmed with present staff. Received instructions for my participation in the procedure from the performing physician.  

## 2021-07-03 ENCOUNTER — Telehealth: Payer: Self-pay

## 2021-07-03 NOTE — Telephone Encounter (Signed)
?  Follow up Call- ? ?Call back number 06/29/2021  ?Post procedure Call Back phone  # [phone]  ?Permission to leave phone message Yes  ?Some recent data might be hidden  ?  ? ?Patient questions: ? ?Do you have a fever, pain , or abdominal swelling? No. ?Pain Score  0 * ? ?Have you tolerated food without any problems? Yes.   ? ?Have you been able to return to your normal activities? Yes.   ? ?Do you have any questions about your discharge instructions: ?Diet   No. ?Medications  No. ?Follow up visit  No. ? ?Do you have questions or concerns about your Care? No. ? ?Actions: ?* If pain score is 4 or above: ?No action needed, pain <4. ? ? ?

## 2021-07-03 NOTE — Telephone Encounter (Signed)
?  Follow up Call- ? ?Call back number 06/29/2021  ?Post procedure Call Back phone  # [phone]  ?Permission to leave phone message Yes  ?Some recent data might be hidden  ? First follow up phone call, LVM ?

## 2021-07-09 ENCOUNTER — Encounter: Payer: Self-pay | Admitting: Internal Medicine

## 2021-08-28 ENCOUNTER — Other Ambulatory Visit: Payer: Self-pay | Admitting: Obstetrics & Gynecology

## 2021-08-28 DIAGNOSIS — Z1231 Encounter for screening mammogram for malignant neoplasm of breast: Secondary | ICD-10-CM

## 2021-09-12 ENCOUNTER — Ambulatory Visit
Admission: RE | Admit: 2021-09-12 | Discharge: 2021-09-12 | Disposition: A | Discharge status: Home / Self Care | Payer: 59 | Source: Ambulatory Visit | Attending: Obstetrics & Gynecology | Admitting: Obstetrics & Gynecology

## 2021-09-12 DIAGNOSIS — Z1231 Encounter for screening mammogram for malignant neoplasm of breast: Secondary | ICD-10-CM

## 2022-02-13 ENCOUNTER — Ambulatory Visit (HOSPITAL_BASED_OUTPATIENT_CLINIC_OR_DEPARTMENT_OTHER): Payer: 59 | Admitting: Obstetrics & Gynecology

## 2022-04-11 ENCOUNTER — Ambulatory Visit (HOSPITAL_BASED_OUTPATIENT_CLINIC_OR_DEPARTMENT_OTHER): Payer: 59 | Admitting: Obstetrics & Gynecology

## 2022-05-07 ENCOUNTER — Encounter (HOSPITAL_BASED_OUTPATIENT_CLINIC_OR_DEPARTMENT_OTHER): Payer: Self-pay | Admitting: Obstetrics & Gynecology

## 2022-05-07 ENCOUNTER — Ambulatory Visit (INDEPENDENT_AMBULATORY_CARE_PROVIDER_SITE_OTHER): Payer: 59 | Admitting: Obstetrics & Gynecology

## 2022-05-07 ENCOUNTER — Other Ambulatory Visit (HOSPITAL_COMMUNITY)
Admission: RE | Admit: 2022-05-07 | Discharge: 2022-05-07 | Disposition: A | Discharge status: Home / Self Care | Payer: 59 | Source: Ambulatory Visit | Attending: Obstetrics & Gynecology | Admitting: Obstetrics & Gynecology

## 2022-05-07 VITALS — BP 143/88 | HR 77 | Ht 66.0 in | Wt 279.6 lb

## 2022-05-07 DIAGNOSIS — Z01419 Encounter for gynecological examination (general) (routine) without abnormal findings: Secondary | ICD-10-CM

## 2022-05-07 DIAGNOSIS — L900 Lichen sclerosus et atrophicus: Secondary | ICD-10-CM | POA: Diagnosis not present

## 2022-05-07 DIAGNOSIS — N898 Other specified noninflammatory disorders of vagina: Secondary | ICD-10-CM

## 2022-05-07 DIAGNOSIS — Z531 Procedure and treatment not carried out because of patient's decision for reasons of belief and group pressure: Secondary | ICD-10-CM

## 2022-05-07 DIAGNOSIS — K649 Unspecified hemorrhoids: Secondary | ICD-10-CM

## 2022-05-07 MED ORDER — HYDROCORTISONE ACETATE 25 MG RE SUPP: 25.0000 mg | Freq: Two times a day (BID) | RECTAL | 2 refills | Status: AC

## 2022-05-07 MED ORDER — TERCONAZOLE 0.4 % VA CREA: 1.0000 | TOPICAL_CREAM | Freq: Every day | VAGINAL | 0 refills | Status: AC

## 2022-05-07 MED ORDER — MOMETASONE FUROATE 0.1 % EX OINT: TOPICAL_OINTMENT | CUTANEOUS | 3 refills | Status: AC

## 2022-05-07 NOTE — Progress Notes (Unsigned)
52 y.o. G0P0000 Divorced White or Caucasian female here for annual exam.  Doing well.  She continues to have issues with hemorrhoids.  At times they are fine but at times can be very itchy.  When they are really prolapsed, they are very uncomfortable with can bleeding.  For example, had five days of rectal bleeding this year.  She needed to wear a pad because of the amount of bleeding.    Just saw PCP.  He discussed Wegovy or topiramate with pt.  She is not comfortable going on this kind of medication.    Does have hx of biopsy provne lichen sclerosus.  Is having itching.  Using mometasone about every other day.  Needs RF for medication.  Patient's last menstrual period was 06/12/2016 (exact date).          Sexually active: No.  The current method of family planning is none.    Smoker:  no  Health Maintenance: Pap:  04/30/2016 Normal History of abnormal Pap:  no MMG:  09/12/2021 Negative Colonoscopy:  06/29/2021, follow up 10 years BMD:   not indicated Screening Labs: done last week with Dr. Donnamarie Poag   reports that she has never smoked. She has never used smokeless tobacco. She reports current alcohol use of about 1.0 standard drink of alcohol per week. She reports that she does not use drugs.  Past Medical History:  Diagnosis Date   Carpal tunnel syndrome 2004   right   Community acquired MRSA infection 2008   Dyspnea    with exertion   Fibrocystic breast    GERD (gastroesophageal reflux disease)    Hyperlipidemia    Hypothyroidism    Kidney stones 12/2015   Left shoulder pain     12/2015   Migraine     Past Surgical History:  Procedure Laterality Date   COLONOSCOPY  06/29/2021   CYSTOSCOPY N/A 06/25/2016   Procedure: CYSTOSCOPY;  Surgeon: Megan Salon, MD;  Location: [facility];  Service: Gynecology;  Laterality: N/A;   DIAGNOSTIC LAPAROSCOPY  1995   LOA, endometriosis   LAPAROSCOPIC BILATERAL SALPINGECTOMY Bilateral 06/25/2016   Procedure: LAPAROSCOPIC BILATERAL SALPINGECTOMY  Poss BSO;  Surgeon: Megan Salon, MD;  Location: [facility];  Service: Gynecology;  Laterality: Bilateral;   LAPAROSCOPIC HYSTERECTOMY N/A 06/25/2016   Procedure: HYSTERECTOMY TOTAL LAPAROSCOPIC;  Surgeon: Megan Salon, MD;  Location: [facility];  Service: Gynecology;  Laterality: N/A;    Current Outpatient Medications  Medication Sig Dispense Refill   albuterol (PROVENTIL) (2.5 MG/3ML) 0.083% nebulizer solution Take 2.5 mg by nebulization every 6 (six) hours as needed for wheezing or shortness of breath.     b complex vitamins tablet Take by mouth.     hydrocortisone (ANUSOL-HC) 25 MG suppository Place 1 suppository (25 mg total) rectally 2 (two) times daily. 15 suppository 2   levothyroxine (SYNTHROID) 112 MCG tablet Take 112 mcg by mouth daily before breakfast. M,W.Fri     LEVOTHYROXINE SODIUM PO Take 110 mcg by mouth. Tu,Thu,Sat,Sun     mometasone (ELOCON) 0.1 % ointment Apply topically twice weekly. 45 g 3   rosuvastatin (CRESTOR) 5 MG tablet Take 5 mg by mouth daily.     SUMAtriptan (IMITREX) 100 MG tablet Take 100 mg by mouth every 2 (two) hours as needed for migraine. May repeat in 2 hours if headache persists or recurs.     No current facility-administered medications for this visit.    Family History  Problem Relation Age of Onset   Breast  cancer Mother 23       02/2013   Esophageal cancer Maternal Uncle    Heart disease Maternal Grandmother    Heart disease Maternal Grandfather    Cancer Paternal Grandmother 2       breast   Breast cancer Paternal Grandmother 29   Colon polyps Neg Hx    Colon cancer Neg Hx    Rectal cancer Neg Hx    Stomach cancer Neg Hx     ROS: Constitutional: negative Genitourinary: vulvar itching  Exam:   BP (!) 143/88 (BP Location: Left Arm, Patient Position: Sitting, Cuff Size: Large)   Pulse 77   Ht '5\' 6"'$  (1.676 m) Comment: Reported  Wt 279 lb 9.6 oz (126.8 kg)   LMP 06/12/2016 (Exact Date)   BMI 45.13 kg/m   Height: '5\' 6"'$  (167.6 cm)  (Reported)  General appearance: alert, cooperative and appears stated age Head: Normocephalic, without obvious abnormality, atraumatic Neck: no adenopathy, supple, symmetrical, trachea midline and thyroid normal to inspection and palpation Lungs: clear to auscultation bilaterally Breasts: normal appearance, no masses or tenderness Heart: regular rate and rhythm Abdomen: soft, non-tender; bowel sounds normal; no masses,  no organomegaly Extremities: extremities normal, atraumatic, no cyanosis or edema Skin: Skin color, texture, turgor normal. No rashes or lesions Lymph nodes: Cervical, supraclavicular, and axillary nodes normal. No abnormal inguinal nodes palpated Neurologic: Grossly normal   Pelvic: External genitalia:  no lesions and no hypopigmentation, vulvar skin appears normal              Urethra:  normal appearing urethra with no masses, tenderness or lesions              Bartholins and Skenes: normal                 Vagina: normal appearing vagina but whitish discharge present, no lesions              Cervix: surgically absent              Pap taken: No. Bimanual Exam:  Uterus:  uterus absent              Adnexa: no mass, fullness, tenderness               Rectovaginal: Confirms               Anus:  normal sphincter tone, no lesions but hemorrhoids noted at 9 o'clock  Chaperone, Octaviano Batty, CMA, was present for exam.  Assessment/Plan: 1. Well woman exam with routine gynecological exam - Pap smear not indicated - Mammogram 09/13/2021 - Colonoscopy 06/29/2021 - Bone mineral density not indicated at this time - lab work done with PCP - vaccines reviewed/updated  2. Bleeding hemorrhoids - Ambulatory referral to General Surgery - hydrocortisone (ANUSOL-HC) 25 MG suppository; Place 1 suppository (25 mg total) rectally 2 (two) times daily.  Dispense: 15 suppository; Refill: 2  3. Lichen sclerosus - mometasone (ELOCON) 0.1 % ointment; Apply topically twice weekly.   Dispense: 45 g; Refill: 3  4. Refusal of blood transfusions as patient is Jehovah's Witness  5. Morbid (severe) obesity due to excess calories (HCC)  6. Vaginal itching - will test for yeast and BV due to discharge.  Hopefully this is source of itching as skin appears normal, otherwise and not consistent with lichen sclerosus changes - Cervicovaginal ancillary only( [facility]) - terconazole (TERAZOL 7) 0.4 % vaginal cream; Place 1 applicator vaginally at bedtime.  Dispense: 45 g; Refill:  0    

## 2022-05-08 LAB — CERVICOVAGINAL ANCILLARY ONLY
Bacterial Vaginitis (gardnerella): NEGATIVE
Candida Glabrata: NEGATIVE
Candida Vaginitis: NEGATIVE
Comment: NEGATIVE
Comment: NEGATIVE
Comment: NEGATIVE

## 2022-06-12 ENCOUNTER — Encounter (HOSPITAL_BASED_OUTPATIENT_CLINIC_OR_DEPARTMENT_OTHER): Payer: Self-pay | Admitting: Obstetrics & Gynecology

## 2022-06-12 ENCOUNTER — Ambulatory Visit (HOSPITAL_BASED_OUTPATIENT_CLINIC_OR_DEPARTMENT_OTHER): Payer: 59 | Admitting: Obstetrics & Gynecology

## 2022-06-12 VITALS — BP 130/90 | HR 74 | Ht 66.0 in | Wt 274.0 lb

## 2022-06-12 DIAGNOSIS — L900 Lichen sclerosus et atrophicus: Secondary | ICD-10-CM

## 2022-06-12 DIAGNOSIS — R030 Elevated blood-pressure reading, without diagnosis of hypertension: Secondary | ICD-10-CM

## 2022-06-12 NOTE — Progress Notes (Signed)
GYNECOLOGY  VISIT  CC:   follow up for lichen sclerosus  HPI: 52 y.o. G0P0000 Divorced White or Caucasian female here for follow up after AEX.  Pt has biopsy proven lichen sclerosus.  Was having a lot more itching.  She has figured out that she was wearing underwear to prevent leakage and she thinks this caused the irritation/itching.  She has been using clobetasol BID and reports symptoms are completely gone.  Not having discharge either.     Past Medical History:  Diagnosis Date   Carpal tunnel syndrome 2004   right   Community acquired MRSA infection 2008   Dyspnea    with exertion   Fibrocystic breast    GERD (gastroesophageal reflux disease)    Hyperlipidemia    Hypothyroidism    Kidney stones 12/2015   Left shoulder pain     12/2015   Migraine     MEDS:   Current Outpatient Medications on File Prior to Visit  Medication Sig Dispense Refill   albuterol (PROVENTIL) (2.5 MG/3ML) 0.083% nebulizer solution Take 2.5 mg by nebulization every 6 (six) hours as needed for wheezing or shortness of breath.     b complex vitamins tablet Take by mouth.     hydrocortisone (ANUSOL-HC) 25 MG suppository Place 1 suppository (25 mg total) rectally 2 (two) times daily. 15 suppository 2   levothyroxine (SYNTHROID) 112 MCG tablet Take 112 mcg by mouth daily before breakfast. M,W.Fri     LEVOTHYROXINE SODIUM PO Take 110 mcg by mouth. Tu,Thu,Sat,Sun     mometasone (ELOCON) 0.1 % ointment Apply topically twice weekly. 45 g 3   rosuvastatin (CRESTOR) 5 MG tablet Take 5 mg by mouth daily.     SUMAtriptan (IMITREX) 100 MG tablet Take 100 mg by mouth every 2 (two) hours as needed for migraine. May repeat in 2 hours if headache persists or recurs.     terconazole (TERAZOL 7) 0.4 % vaginal cream Place 1 applicator vaginally at bedtime. 45 g 0   No current facility-administered medications on file prior to visit.    ALLERGIES: Estring [estradiol]  SH:  single, non smoker  Review of Systems   Constitutional: Negative.   Genitourinary: Negative.     PHYSICAL EXAMINATION:    BP (!) 130/90   Pulse 74   Ht '5\' 6"'$  (1.676 m) Comment: Reported  Wt 274 lb (124.3 kg)   LMP 06/12/2016 (Exact Date)   BMI 44.22 kg/m     General appearance: alert, cooperative and appears stated age Lymph:  no inguinal LAD noted  Pelvic: External genitalia:  completely normal appearing tissue today              Urethra:  normal appearing urethra with no masses, tenderness or lesions              Bartholins and Skenes: normal                Chaperone, Octaviano Batty, CMA, was present for exam.  Assessment/Plan: 1. Lichen sclerosus - much improved today - pt will taper the clobetasol to nightly x 7 nights and then twice weekly for a week.  Then she will transition to the mometasone.  Already has rx for this.  2. Elevated blood pressure reading - pt will monitor at home.  Has access to cuff.  If remains elevated, she is to let me know or PCP.

## 2022-10-16 ENCOUNTER — Other Ambulatory Visit: Payer: Self-pay | Admitting: Obstetrics & Gynecology

## 2022-10-16 DIAGNOSIS — Z0000 Encounter for general adult medical examination without abnormal findings: Secondary | ICD-10-CM

## 2022-10-19 ENCOUNTER — Ambulatory Visit
Admission: RE | Admit: 2022-10-19 | Discharge: 2022-10-19 | Disposition: A | Discharge status: Home / Self Care | Payer: 59 | Source: Ambulatory Visit | Attending: Obstetrics & Gynecology | Admitting: Obstetrics & Gynecology

## 2022-10-19 DIAGNOSIS — Z0000 Encounter for general adult medical examination without abnormal findings: Secondary | ICD-10-CM

## 2023-01-10 IMAGING — MG MM DIGITAL SCREENING BILAT W/ TOMO AND CAD
6 of 12 series · 6 of 36 positions shown · non-contrast
Comparison: Previous exam(s).

CLINICAL DATA: Screening.

EXAM:
DIGITAL SCREENING BILATERAL MAMMOGRAM WITH TOMOSYNTHESIS AND CAD
TECHNIQUE: Bilateral screening digital craniocaudal and mediolateral oblique
mammograms were obtained. Bilateral screening digital breast
tomosynthesis was performed. The images were evaluated with
computer-aided detection.

[R CC synth-2D (1 of 2)]
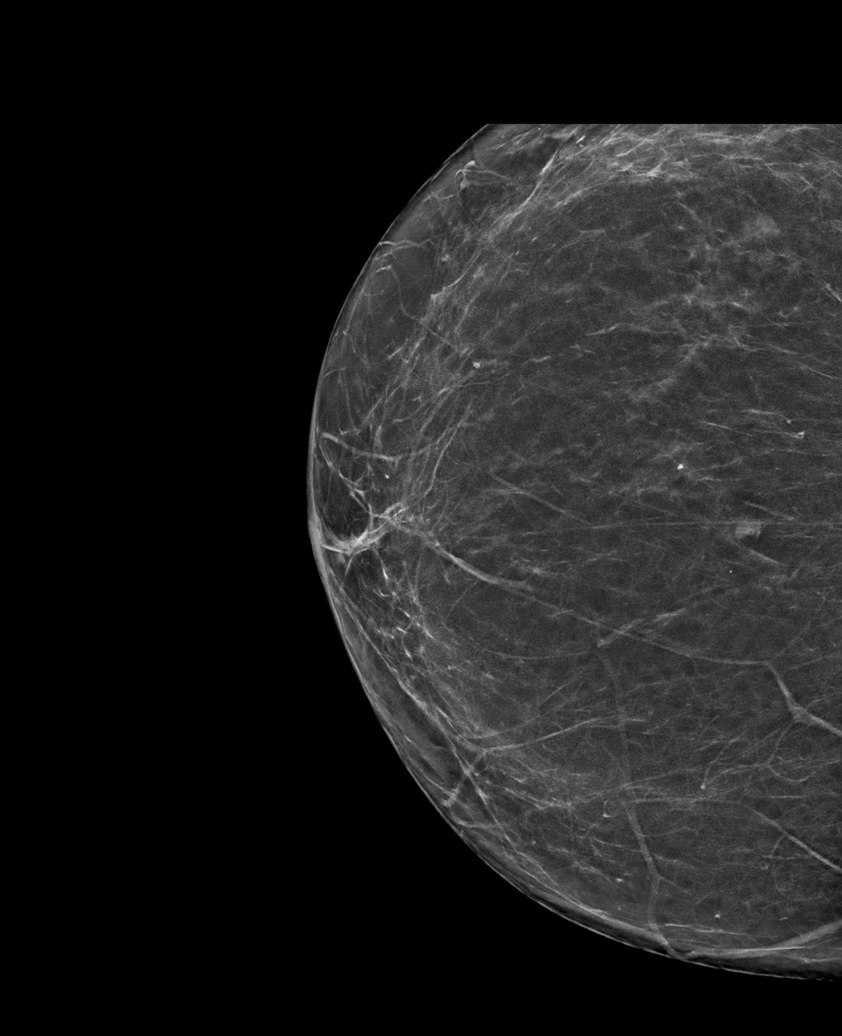

[R CC synth-2D (2 of 2)]
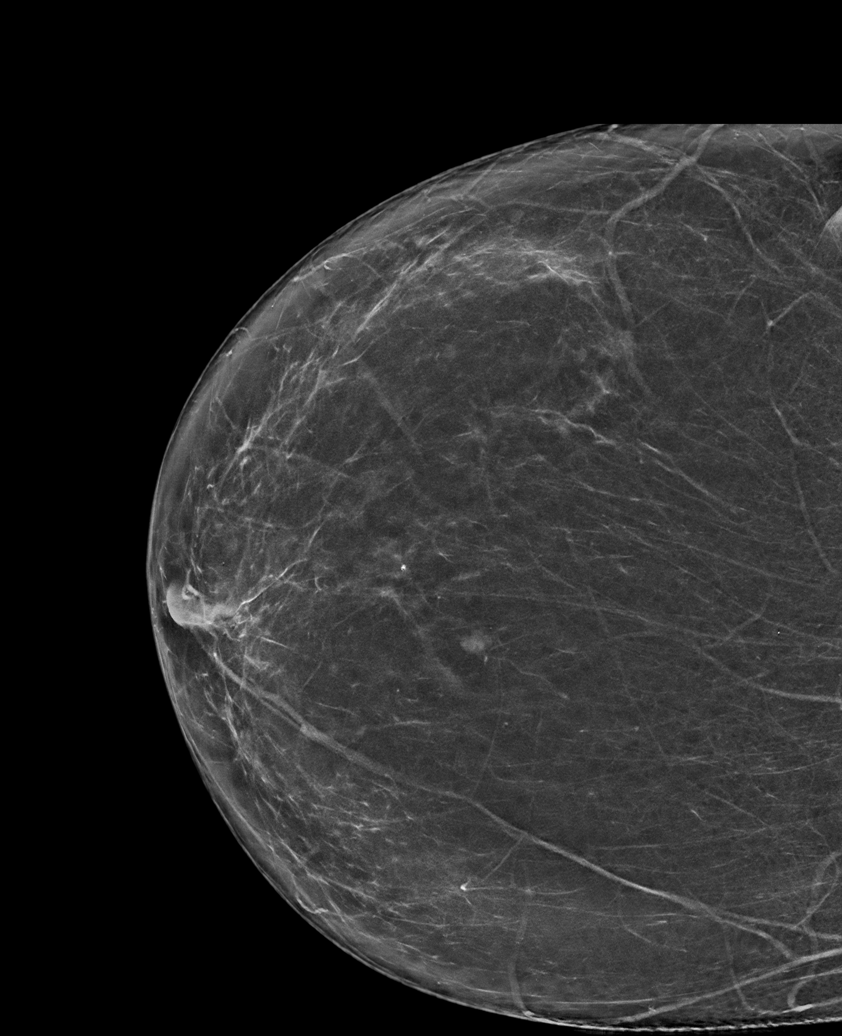

[L MLO synth-2D]
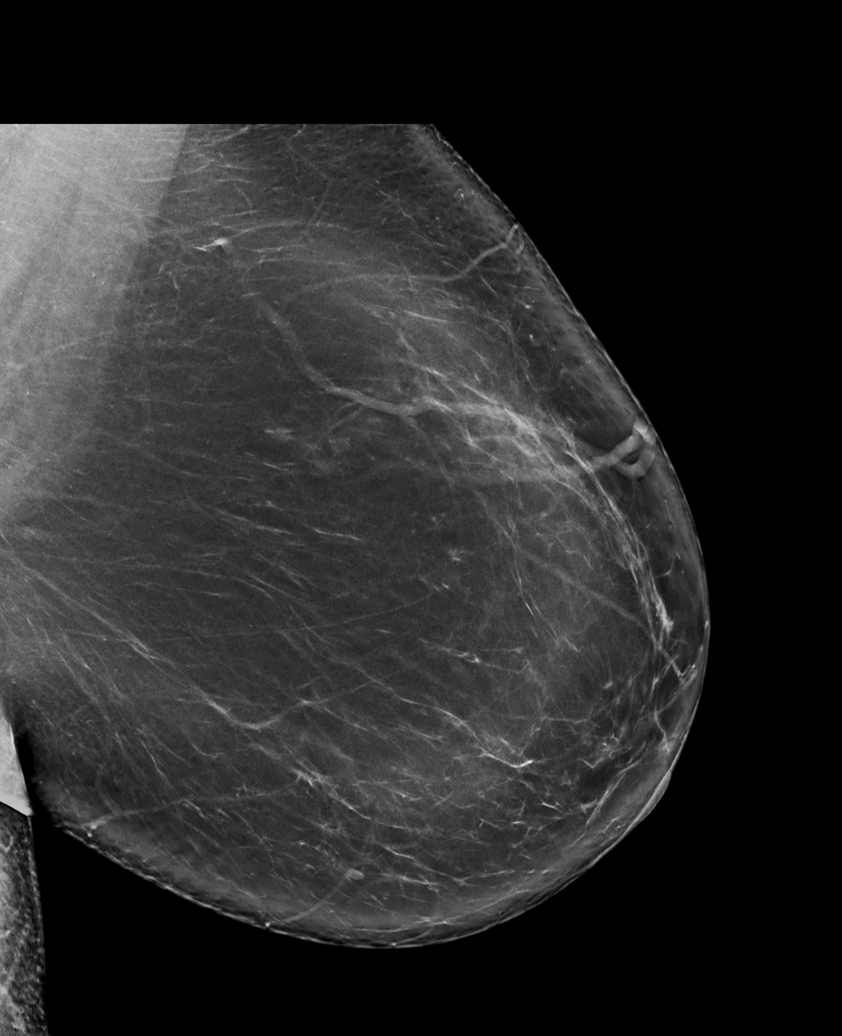

[R MLO synth-2D]
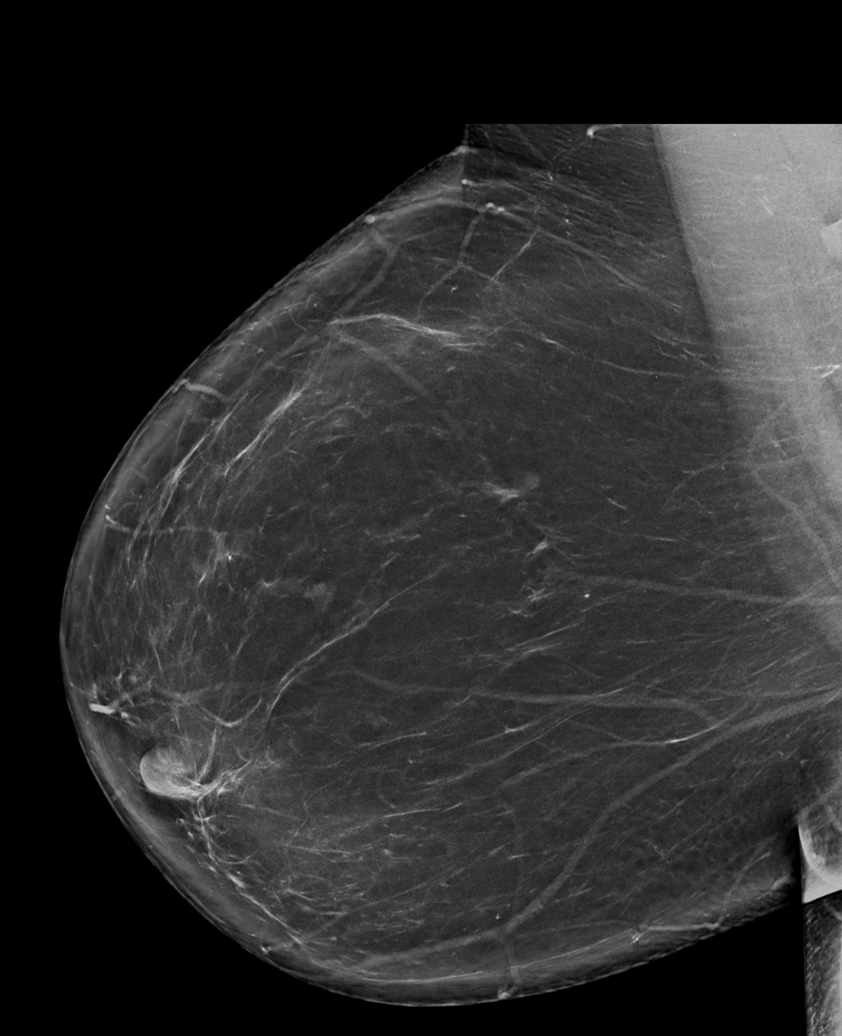

[L CC synth-2D]
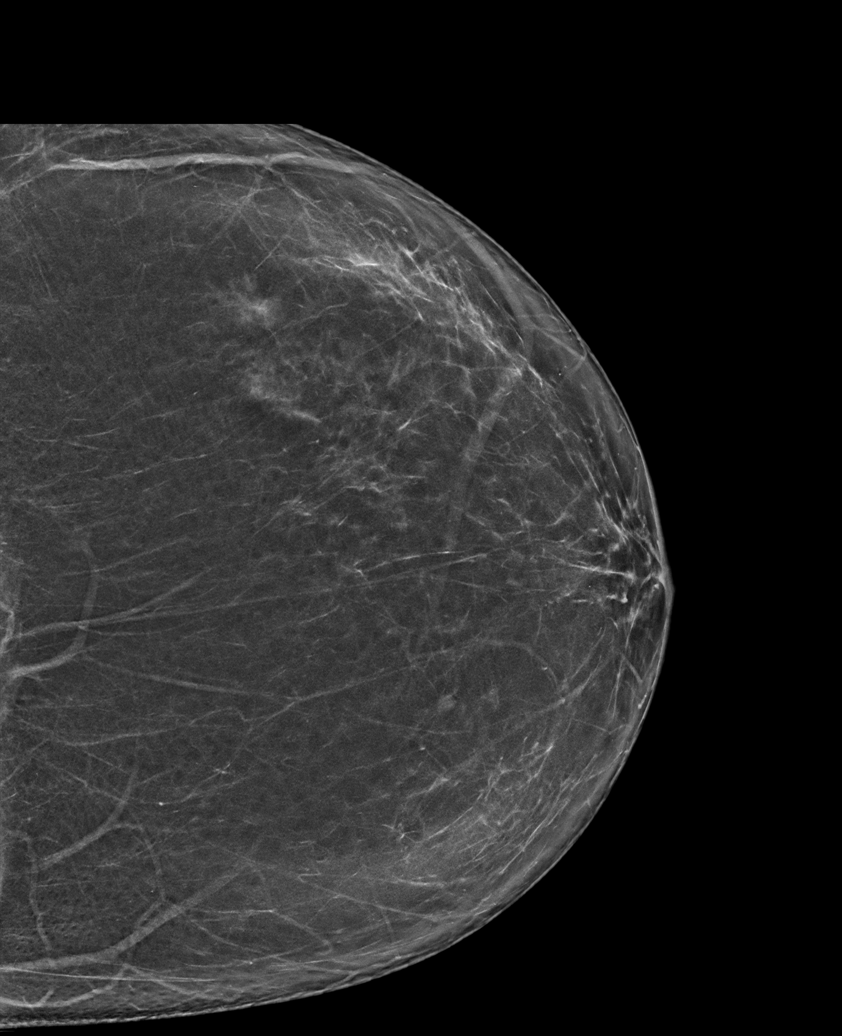

[R CV synth-2D]
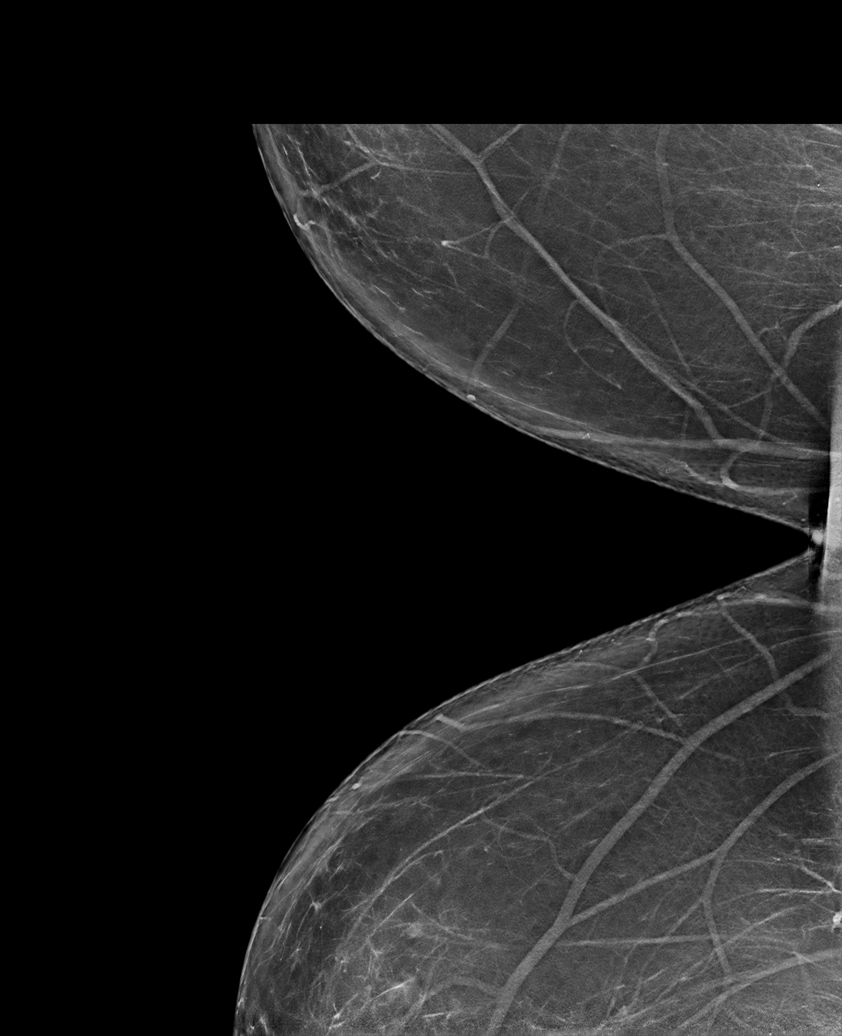

[6 of 36 positions shown; findings below may reference images not displayed]

ACR Breast Density Category b: There are scattered areas of
fibroglandular density.
FINDINGS: There are no findings suspicious for malignancy.
IMPRESSION: No mammographic evidence of malignancy. A result letter of this
screening mammogram will be mailed directly to the patient.

RECOMMENDATION:
Screening mammogram in one year. (Code:51-O-LD2)

BI-RADS CATEGORY  1: Negative.

## 2024-02-07 ENCOUNTER — Other Ambulatory Visit: Payer: Self-pay | Admitting: Obstetrics & Gynecology

## 2024-02-07 DIAGNOSIS — Z1231 Encounter for screening mammogram for malignant neoplasm of breast: Secondary | ICD-10-CM

## 2024-02-26 ENCOUNTER — Ambulatory Visit
Admission: RE | Admit: 2024-02-26 | Discharge: 2024-02-26 | Disposition: A | Discharge status: Home / Self Care | Source: Ambulatory Visit

## 2024-02-26 DIAGNOSIS — Z1231 Encounter for screening mammogram for malignant neoplasm of breast: Secondary | ICD-10-CM

## 2024-10-21 ENCOUNTER — Ambulatory Visit (HOSPITAL_BASED_OUTPATIENT_CLINIC_OR_DEPARTMENT_OTHER): Payer: Self-pay | Admitting: Obstetrics & Gynecology
